# Patient Record
Sex: Male | Born: 1995 | Race: White | Hispanic: No | Marital: Single | Smoking: Heavy tobacco smoker
Health system: Southern US, Community
[De-identification: ages and names within clinical notes are randomized; demographics above are authoritative.]

## PROBLEM LIST (undated history)

## (undated) ENCOUNTER — Emergency Department (HOSPITAL_COMMUNITY): Discharge status: Absent without leave | Payer: Self-pay | Attending: Emergency Medicine | Admitting: Emergency Medicine

## (undated) DIAGNOSIS — M629 Disorder of muscle, unspecified: Secondary | ICD-10-CM

## (undated) DIAGNOSIS — F329 Major depressive disorder, single episode, unspecified: Secondary | ICD-10-CM

## (undated) DIAGNOSIS — F32A Depression, unspecified: Secondary | ICD-10-CM

## (undated) DIAGNOSIS — F419 Anxiety disorder, unspecified: Secondary | ICD-10-CM

## (undated) HISTORY — PX: TONSILLECTOMY: SUR1361

---
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
## 2016-11-25 ENCOUNTER — Ambulatory Visit (HOSPITAL_BASED_OUTPATIENT_CLINIC_OR_DEPARTMENT_OTHER)
Admission: EM | Admit: 2016-11-25 | Discharge: 2016-11-25 | Disposition: A | Discharge status: Home / Self Care | Payer: BLUE CROSS/BLUE SHIELD | Attending: Emergency Medicine | Admitting: Emergency Medicine

## 2016-11-25 ENCOUNTER — Emergency Department (HOSPITAL_COMMUNITY): Payer: BLUE CROSS/BLUE SHIELD | Admitting: Anesthesiology

## 2016-11-25 ENCOUNTER — Emergency Department (HOSPITAL_BASED_OUTPATIENT_CLINIC_OR_DEPARTMENT_OTHER): Payer: BLUE CROSS/BLUE SHIELD

## 2016-11-25 ENCOUNTER — Encounter (HOSPITAL_BASED_OUTPATIENT_CLINIC_OR_DEPARTMENT_OTHER): Payer: Self-pay | Admitting: Emergency Medicine

## 2016-11-25 ENCOUNTER — Encounter (HOSPITAL_COMMUNITY)
Admission: EM | Disposition: A | Discharge status: Home / Self Care | Payer: Self-pay | Source: Home / Self Care | Attending: Emergency Medicine

## 2016-11-25 DIAGNOSIS — S62633B Displaced fracture of distal phalanx of left middle finger, initial encounter for open fracture: Secondary | ICD-10-CM

## 2016-11-25 DIAGNOSIS — S61319A Laceration without foreign body of unspecified finger with damage to nail, initial encounter: Secondary | ICD-10-CM

## 2016-11-25 DIAGNOSIS — W298XXA Contact with other powered powered hand tools and household machinery, initial encounter: Secondary | ICD-10-CM | POA: Insufficient documentation

## 2016-11-25 DIAGNOSIS — S62631B Displaced fracture of distal phalanx of left index finger, initial encounter for open fracture: Secondary | ICD-10-CM | POA: Insufficient documentation

## 2016-11-25 HISTORY — PX: CLOSED REDUCTION FINGER WITH PERCUTANEOUS PINNING: SHX5612

## 2016-11-25 SURGERY — CLOSED REDUCTION, FINGER, WITH PERCUTANEOUS PINNING
Anesthesia: General | Site: Finger | Laterality: Left

## 2016-11-25 MED ORDER — BUPIVACAINE HCL (PF) 0.25 % IJ SOLN
INTRAMUSCULAR | Status: AC
  Filled 2016-11-25: qty 30

## 2016-11-25 MED ORDER — MORPHINE SULFATE (PF) 4 MG/ML IV SOLN
4.0000 mg | Freq: Once | INTRAVENOUS | Status: AC
  Administered 2016-11-25: 4 mg via INTRAVENOUS
  Filled 2016-11-25: qty 1

## 2016-11-25 MED ORDER — FENTANYL CITRATE (PF) 250 MCG/5ML IJ SOLN
INTRAMUSCULAR | Status: DC | PRN
  Administered 2016-11-25 (×2): 50 ug via INTRAVENOUS

## 2016-11-25 MED ORDER — HYDROMORPHONE HCL 1 MG/ML IJ SOLN: 0.2500 mg | INTRAMUSCULAR | Status: DC | PRN

## 2016-11-25 MED ORDER — CEFAZOLIN IN D5W 1 GM/50ML IV SOLN
INTRAVENOUS | Status: DC | PRN
  Administered 2016-11-25: 1 g via INTRAVENOUS

## 2016-11-25 MED ORDER — BUPIVACAINE HCL (PF) 0.25 % IJ SOLN
INTRAMUSCULAR | Status: DC | PRN
  Administered 2016-11-25: 7 mL

## 2016-11-25 MED ORDER — DOCUSATE SODIUM 100 MG PO CAPS: 100.0000 mg | ORAL_CAPSULE | Freq: Two times a day (BID) | ORAL | 0 refills | Status: DC

## 2016-11-25 MED ORDER — OXYCODONE-ACETAMINOPHEN 5-325 MG PO TABS: 1.0000 | ORAL_TABLET | Freq: Three times a day (TID) | ORAL | 0 refills | Status: AC

## 2016-11-25 MED ORDER — TETANUS-DIPHTH-ACELL PERTUSSIS 5-2.5-18.5 LF-MCG/0.5 IM SUSP
0.5000 mL | Freq: Once | INTRAMUSCULAR | Status: AC
  Administered 2016-11-25: 0.5 mL via INTRAMUSCULAR
  Filled 2016-11-25: qty 0.5

## 2016-11-25 MED ORDER — CEFAZOLIN IN D5W 1 GM/50ML IV SOLN
1.0000 g | Freq: Once | INTRAVENOUS | Status: AC
  Administered 2016-11-25: 1 g via INTRAVENOUS

## 2016-11-25 MED ORDER — FENTANYL CITRATE (PF) 100 MCG/2ML IJ SOLN
INTRAMUSCULAR | Status: AC
  Filled 2016-11-25: qty 2

## 2016-11-25 MED ORDER — MIDAZOLAM HCL 2 MG/2ML IJ SOLN
INTRAMUSCULAR | Status: AC
  Filled 2016-11-25: qty 2

## 2016-11-25 MED ORDER — MIDAZOLAM HCL 5 MG/5ML IJ SOLN
INTRAMUSCULAR | Status: DC | PRN
  Administered 2016-11-25: 2 mg via INTRAVENOUS

## 2016-11-25 MED ORDER — SUCCINYLCHOLINE CHLORIDE 20 MG/ML IJ SOLN
INTRAMUSCULAR | Status: DC | PRN
  Administered 2016-11-25: 120 mg via INTRAVENOUS

## 2016-11-25 MED ORDER — PROPOFOL 10 MG/ML IV BOLUS
INTRAVENOUS | Status: DC | PRN
  Administered 2016-11-25: 80 mg via INTRAVENOUS
  Administered 2016-11-25: 200 mg via INTRAVENOUS

## 2016-11-25 MED ORDER — 0.9 % SODIUM CHLORIDE (POUR BTL) OPTIME
TOPICAL | Status: DC | PRN
  Administered 2016-11-25: 1000 mL

## 2016-11-25 MED ORDER — CEPHALEXIN 500 MG PO CAPS: 500.0000 mg | ORAL_CAPSULE | Freq: Four times a day (QID) | ORAL | 0 refills | Status: DC

## 2016-11-25 MED ORDER — PROPOFOL 10 MG/ML IV BOLUS
INTRAVENOUS | Status: AC
  Filled 2016-11-25: qty 40

## 2016-11-25 MED ORDER — ONDANSETRON HCL 4 MG/2ML IJ SOLN
INTRAMUSCULAR | Status: DC | PRN
  Administered 2016-11-25: 4 mg via INTRAVENOUS

## 2016-11-25 MED ORDER — CEFAZOLIN IN D5W 1 GM/50ML IV SOLN
INTRAVENOUS | Status: AC
  Administered 2016-11-25: 1 g via INTRAVENOUS
  Filled 2016-11-25: qty 50

## 2016-11-25 MED ORDER — LIDOCAINE HCL (CARDIAC) 20 MG/ML IV SOLN
INTRAVENOUS | Status: DC | PRN
  Administered 2016-11-25: 60 mg via INTRATRACHEAL

## 2016-11-25 MED ORDER — LACTATED RINGERS IV SOLN
INTRAVENOUS | Status: DC | PRN
  Administered 2016-11-25: 19:00:00 via INTRAVENOUS

## 2016-11-25 SURGICAL SUPPLY — 43 items
BANDAGE ACE 4X5 VEL STRL LF (GAUZE/BANDAGES/DRESSINGS) IMPLANT
BANDAGE ELASTIC 3 VELCRO ST LF (GAUZE/BANDAGES/DRESSINGS) IMPLANT
BENZOIN TINCTURE PRP APPL 2/3 (GAUZE/BANDAGES/DRESSINGS) IMPLANT
BLADE SURG ROTATE 9660 (MISCELLANEOUS) IMPLANT
BNDG COHESIVE 1X5 TAN STRL LF (GAUZE/BANDAGES/DRESSINGS) ×3 IMPLANT
BNDG CONFORM 2 STRL LF (GAUZE/BANDAGES/DRESSINGS) ×3 IMPLANT
BNDG GAUZE ELAST 4 BULKY (GAUZE/BANDAGES/DRESSINGS) IMPLANT
CLOSURE WOUND 1/2 X4 (GAUZE/BANDAGES/DRESSINGS)
COVER SURGICAL LIGHT HANDLE (MISCELLANEOUS) ×3 IMPLANT
CUFF TOURNIQUET SINGLE 18IN (TOURNIQUET CUFF) ×3 IMPLANT
CUFF TOURNIQUET SINGLE 24IN (TOURNIQUET CUFF) IMPLANT
DRAPE OEC MINIVIEW 54X84 (DRAPES) ×3 IMPLANT
DRSG EMULSION OIL 3X3 NADH (GAUZE/BANDAGES/DRESSINGS) IMPLANT
GAUZE SPONGE 4X4 12PLY STRL (GAUZE/BANDAGES/DRESSINGS) ×3 IMPLANT
GAUZE XEROFORM 1X8 LF (GAUZE/BANDAGES/DRESSINGS) ×3 IMPLANT
GLOVE BIOGEL PI IND STRL 7.0 (GLOVE) ×1 IMPLANT
GLOVE BIOGEL PI IND STRL 8.5 (GLOVE) ×1 IMPLANT
GLOVE BIOGEL PI INDICATOR 7.0 (GLOVE) ×2
GLOVE BIOGEL PI INDICATOR 8.5 (GLOVE) ×2
GLOVE SURG ORTHO 8.0 STRL STRW (GLOVE) ×3 IMPLANT
GOWN STRL REUS W/ TWL LRG LVL3 (GOWN DISPOSABLE) ×3 IMPLANT
GOWN STRL REUS W/TWL LRG LVL3 (GOWN DISPOSABLE) ×6
K-WIRE .045X45 (WIRE) ×6 IMPLANT
KIT BASIN OR (CUSTOM PROCEDURE TRAY) ×3 IMPLANT
KIT ROOM TURNOVER OR (KITS) ×3 IMPLANT
NEEDLE HYPO 25GX1X1/2 BEV (NEEDLE) ×3 IMPLANT
NS IRRIG 1000ML POUR BTL (IV SOLUTION) ×3 IMPLANT
PACK ORTHO EXTREMITY (CUSTOM PROCEDURE TRAY) ×3 IMPLANT
PAD ARMBOARD 7.5X6 YLW CONV (MISCELLANEOUS) ×6 IMPLANT
SOAP 2 % CHG 4 OZ (WOUND CARE) ×3 IMPLANT
SPLINT FINGER (SOFTGOODS) ×6 IMPLANT
SPONGE GAUZE 4X4 12PLY STER LF (GAUZE/BANDAGES/DRESSINGS) ×3 IMPLANT
STRIP CLOSURE SKIN 1/2X4 (GAUZE/BANDAGES/DRESSINGS) IMPLANT
SUT CHROMIC 6 0 PS 4 (SUTURE) ×3 IMPLANT
SUT ETHILON 4 0 P 3 18 (SUTURE) IMPLANT
SUT ETHILON 5 0 P 3 18 (SUTURE)
SUT NYLON ETHILON 5-0 P-3 1X18 (SUTURE) IMPLANT
SUT PROLENE 4 0 P 3 18 (SUTURE) IMPLANT
SUT PROLENE 4 0 PS 2 18 (SUTURE) ×6 IMPLANT
SYR CONTROL 10ML LL (SYRINGE) ×3 IMPLANT
TOWEL OR 17X24 6PK STRL BLUE (TOWEL DISPOSABLE) ×3 IMPLANT
TOWEL OR 17X26 10 PK STRL BLUE (TOWEL DISPOSABLE) ×3 IMPLANT
WATER STERILE IRR 1000ML POUR (IV SOLUTION) IMPLANT

## 2016-11-25 NOTE — H&P (Signed)
Todd Aguilar is an 21 y.o. male.   Chief Complaint:Left index and long finger open distal phalanx injuries, finger pain HPI: Todd Aguilar is a 21 y.o. male who presents to the Emergency Department complaining of left hand injury that occurred about 3  hours ago. He states he accidentally cut his left 2nd and 3rd fingers with a table saw. He reports associated wounds and constant moderate pain rated as a 7/10. His tetanus status is unknown. He denies any other complaints.  Last had soda at 130PM.   History reviewed. No pertinent past medical history.  Past Surgical History:  Procedure Laterality Date  . TONSILLECTOMY      History reviewed. No pertinent family history. Social History:  reports that he has never smoked. He has never used smokeless tobacco. He reports that he does not drink alcohol or use drugs.  Allergies: No Known Allergies  Medications Prior to Admission  Medication Sig Dispense Refill  . Fexofenadine HCl ([name] PO) Take by mouth.    . Omega-3 Fatty Acids (FISH OIL PO) Take 1 tablet by mouth daily.      No results found for this or any previous visit (from the past 48 hour(s)). Dg Hand Complete Left  Result Date: 11/25/2016 CLINICAL DATA:  Sol accident with trauma to the second third digits, initial encounter EXAM: LEFT HAND - COMPLETE 3+ VIEW COMPARISON:  None. FINDINGS: Comminuted fractures of the second and third distal phalanges are seen. Overlying soft tissue injury is noted as well. No other bony abnormality is seen. IMPRESSION: Comminuted fractures of the second and third distal phalanges consistent with the given clinical history. Electronically Signed   By: Alcide Clever M.D.   On: 11/25/2016 16:29    ROS; NO RECENT ILLNESSES OR HOSPITALIZATIONS  Blood pressure 129/77, pulse 64, temperature 97.8 F (36.6 C), temperature source Oral, resp. rate 20, height 5\' 5"  (1.651 m), weight 180 lb (81.6 kg), SpO2 100 %. Physical Exam  General Appearance:  Alert,  cooperative, no distress, appears stated age  Head:  Normocephalic, without obvious abnormality, atraumatic  Eyes:  Pupils equal, conjunctiva/corneas clear,         Throat: Lips, mucosa, and tongue normal; teeth and gums normal  Neck: No visible masses     Lungs:   respirations unlabored  Chest Wall:  No tenderness or deformity  Heart:  Regular rate and rhythm,  Abdomen:   Soft, non-tender,         Extremities: LEFT INDEX AND LONG FINGER: PICTURES IN CHART, FINGER TIPS WARM WELL PERFUSED LACERATIONS INVOLVE BOTH NAIL BEDS ABLE TO FLEX DIP JOINTS ABLE TO FLEX PIP JOINTS  Pulses: 2+ and symmetric  Skin: Skin color, texture, turgor normal, no rashes or lesions     Neurologic: Normal    Assessment/Plan LEFT OPEN INDEX AND LONG FINGER DISTAL PHALANGEAL FRACTURES  LEFT INDEX AND LONG FINGER OPEN DEBRIDEMENTS AND REPAIR AS INDICATED  R/B/A DISCUSSED WITH PT IN HOSPITAL.  PT VOICED UNDERSTANDING OF PLAN CONSENT SIGNED DAY OF SURGERY PT SEEN AND EXAMINED PRIOR TO OPERATIVE PROCEDURE/DAY OF SURGERY SITE MARKED. QUESTIONS ANSWERED WILL GO HOME FOLLOWING SURGERY  WE ARE PLANNING SURGERY FOR YOUR UPPER EXTREMITY. THE RISKS AND BENEFITS OF SURGERY INCLUDE BUT NOT LIMITED TO BLEEDING INFECTION, DAMAGE TO NEARBY NERVES ARTERIES TENDONS, FAILURE OF SURGERY TO ACCOMPLISH ITS INTENDED GOALS, PERSISTENT SYMPTOMS AND NEED FOR FURTHER SURGICAL INTERVENTION. WITH THIS IN MIND WE WILL PROCEED. I HAVE DISCUSSED WITH THE PATIENT THE PRE AND POSTOPERATIVE REGIMEN AND THE  DOS AND DON'TS. PT VOICED UNDERSTANDING AND INFORMED CONSENT SIGNED.  Todd Aguilar 11/25/2016, 7:13 PM

## 2016-11-25 NOTE — ED Triage Notes (Signed)
Patient has a partially amputated left 1st finger tip and 2nd finger from a table saw

## 2016-11-25 NOTE — ED Notes (Signed)
1st and 2nd digits wrapped seperately with saline guaze and a kling. Kling wrapped placed over IV site. Patient and mother understand patient is to go directly to [facility] and [facility] being [facility]. Patient is also aware to not eat or drink anything while en route.

## 2016-11-25 NOTE — Transfer of Care (Signed)
Immediate Anesthesia Transfer of Care Note  Patient: Todd Aguilar  Procedure(s) Performed: Procedure(s) with comments: CLOSED REDUCTION WITH PERCUTANEOUS PINNING - INDEX AND LONG FINGERS (Left) - LEFT INDEX AND LONG FINGERS  Patient Location: PACU  Anesthesia Type:General  Level of Consciousness: awake  Airway & Oxygen Therapy: Patient Spontanous Breathing  Post-op Assessment: Report given to RN and Post -op Vital signs reviewed and stable  Post vital signs: Reviewed and stable  Last Vitals:  Vitals:   11/25/16 1700 11/25/16 2028  BP: 129/77 (P) 113/64  Pulse: 64 (P) 99  Resp: 20   Temp:  (P) 36.7 C    Last Pain:  Vitals:   11/25/16 1656  TempSrc:   PainSc: 6          Complications: No apparent anesthesia complications

## 2016-11-25 NOTE — Anesthesia Preprocedure Evaluation (Addendum)
Anesthesia Evaluation  Patient identified by MRN, date of birth, ID band Patient awake    Reviewed: Allergy & Precautions, H&P , NPO status , Patient's Chart, lab work & pertinent test results  Airway Mallampati: I  TM Distance: >3 FB Neck ROM: Full    Dental no notable dental hx. (+) Teeth Intact, Dental Advisory Given   Pulmonary neg pulmonary ROS,    Pulmonary exam normal breath sounds clear to auscultation       Cardiovascular negative cardio ROS   Rhythm:Regular Rate:Normal     Neuro/Psych negative neurological ROS  negative psych ROS   GI/Hepatic negative GI ROS, Neg liver ROS,   Endo/Other  negative endocrine ROS  Renal/GU negative Renal ROS  negative genitourinary   Musculoskeletal   Abdominal   Peds  Hematology negative hematology ROS (+)   Anesthesia Other Findings   Reproductive/Obstetrics negative OB ROS                            Anesthesia Physical Anesthesia Plan  ASA: I and emergent  Anesthesia Plan: General   Post-op Pain Management:    Induction: Intravenous, Rapid sequence and Cricoid pressure planned  Airway Management Planned: Oral ETT  Additional Equipment:   Intra-op Plan:   Post-operative Plan: Extubation in OR  Informed Consent: I have reviewed the patients History and Physical, chart, labs and discussed the procedure including the risks, benefits and alternatives for the proposed anesthesia with the patient or authorized representative who has indicated his/her understanding and acceptance.   Dental advisory given  Plan Discussed with: CRNA, Anesthesiologist and Surgeon  Anesthesia Plan Comments:        Anesthesia Quick Evaluation

## 2016-11-25 NOTE — Brief Op Note (Signed)
JOB ID dictated: [id number] orif left long and index finger open reduction and internal fixation and repair  Home after surgery F/u in office in 8 days

## 2016-11-25 NOTE — ED Provider Notes (Signed)
[facility] Provider Note   CSN: [id number] Arrival date & time: 11/25/16  1517  By signing my name below, I, Modena JanskyAlbert Thayil, attest that this documentation has been prepared under the direction and in the presence of Alvira MondayErin Barbera Perritt, MD. Electronically Signed: Modena JanskyAlbert Thayil, Scribe. 11/25/2016. 4:27 PM.  History   Chief Complaint Chief Complaint  Patient presents with  . Hand Injury   The history is provided by the patient. No language interpreter was used.   HPI Comments: Todd Aguilar is a 21 y.o. male who presents to the Emergency Department complaining of left hand injury that occurred about 1.5 hours ago. He states he accidentally cut his left 2nd and 3rd fingers with a table saw. He reports associated wounds and constant moderate pain rated as a 7/10. His tetanus status is unknown. He denies any other complaints.  Last had soda at 130PM.   History reviewed. No pertinent past medical history.  There are no active problems to display for this patient.   Past Surgical History:  Procedure Laterality Date  . TONSILLECTOMY         Home Medications    Prior to Admission medications   Medication Sig Start Date End Date Taking? Authorizing Provider  Fexofenadine HCl ([name] PO) Take by mouth.    Historical Provider, MD  Omega-3 Fatty Acids (FISH OIL PO) Take 1 tablet by mouth daily.    Historical Provider, MD    Family History History reviewed. No pertinent family history.  Social History Social History  Substance Use Topics  . Smoking status: Never Smoker  . Smokeless tobacco: Never Used  . Alcohol use No     Allergies   Patient has no known allergies.   Review of Systems Review of Systems  Musculoskeletal: Positive for myalgias.  Skin: Positive for wound.     Physical Exam Updated Vital Signs BP 136/85   Pulse 69   Temp 97.8 F (36.6 C) (Oral)   Resp 22   Ht 5\' 5"  (1.651 m)   Wt 180 lb (81.6 kg)   SpO2 100%   BMI 29.95 kg/m    Physical Exam  Constitutional: He appears well-developed and well-nourished. No distress.  HENT:  Head: Normocephalic and atraumatic.  Eyes: Conjunctivae are normal.  Neck: Neck supple.  Cardiovascular: Normal rate.   Pulmonary/Chest: Effort normal.  Abdominal: Soft.  Musculoskeletal: Normal range of motion.  See photos Reports altered sensation of tips of fingers, however has sensation Tips of fingers pink, cap refill difficult to assess  Neurological: He is alert.  Skin: Skin is warm and dry.  2nd finger: 3cm defect with loss of nail going through and though. Fingertip is pink.  3rd finger: Laceration that wraps around the ulnar side of the tip initiating at the nail bed. Approximately 4 cm. Macerated tissue.   Psychiatric: He has a normal mood and affect.  Nursing note and vitals reviewed.     ED Treatments / Results  DIAGNOSTIC STUDIES: Oxygen Saturation is 100% on RA, normal by my interpretation.    COORDINATION OF CARE: 4:31 PM- Pt advised of plan for treatment and pt agrees.  Labs (all labs ordered are listed, but only abnormal results are displayed) Labs Reviewed - No data to display  EKG  EKG Interpretation None       Radiology Dg Hand Complete Left  Result Date: 11/25/2016 CLINICAL DATA:  Sol accident with trauma to the second third digits, initial encounter EXAM: LEFT HAND - COMPLETE 3+ VIEW COMPARISON:  None. FINDINGS: Comminuted fractures of the second and third distal phalanges are seen. Overlying soft tissue injury is noted as well. No other bony abnormality is seen. IMPRESSION: Comminuted fractures of the second and third distal phalanges consistent with the given clinical history. Electronically Signed   By: Alcide Clever M.D.   On: 11/25/2016 16:29    Procedures Procedures (including critical care time)  Medications Ordered in ED Medications  ceFAZolin (ANCEF) IVPB 1 g/50 mL premix (0 g Intravenous Stopped 11/25/16 1647)  morphine 4 MG/ML  injection 4 mg (4 mg Intravenous Given 11/25/16 1548)  Tdap (BOOSTRIX) injection 0.5 mL (0.5 mLs Intramuscular Given 11/25/16 1657)     Initial Impression / Assessment and Plan / ED Course  I have reviewed the triage vital signs and the nursing notes.  Pertinent labs & imaging results that were available during my care of the patient were reviewed by me and considered in my medical decision making (see chart for details).            21 year old male with a similar medical history presents with concern for laceration and pain to his index and middle finger after cutting it with a table saw at 3:00 today. Patient is not sure if he is up-to-date on tetanus, and tetanus status was updated. He is given Ancef for concern of open fracture. X-ray shows comminuted middle and index finger distal phalanx fractures.   Discussed with Dr. Melvyn Novas given open distal phalanx fractures with loss of tissue nail, and he will be seeing the patient at [facility] and perform repair.   Final Clinical Impressions(s) / ED Diagnoses   Final diagnoses:  Laceration of finger of left hand with damage to nail, foreign body presence unspecified, unspecified finger, initial encounter  Displaced fracture of distal phalanx of left middle finger, initial encounter for open fracture  Open displaced fracture of distal phalanx of left index finger, initial encounter    New Prescriptions New Prescriptions   No medications on file   I personally performed the services described in this documentation, which was scribed in my presence. The recorded information has been reviewed and is accurate.     Alvira Monday, MD 11/25/16 [phone]

## 2016-11-25 NOTE — Anesthesia Procedure Notes (Signed)
Procedure Name: Intubation Date/Time: 11/25/2016 7:32 PM Performed by: Brien MatesMAHONY, Xylah Early D Pre-anesthesia Checklist: Patient identified, Emergency Drugs available, Suction available, Patient being monitored and Timeout performed Patient Re-evaluated:Patient Re-evaluated prior to inductionOxygen Delivery Method: Circle system utilized Preoxygenation: Pre-oxygenation with 100% oxygen Intubation Type: IV induction, Rapid sequence and Cricoid Pressure applied Laryngoscope Size: Miller and 2 Grade View: Grade I Tube type: Oral Tube size: 7.5 mm Number of attempts: 1 Airway Equipment and Method: Stylet Placement Confirmation: ETT inserted through vocal cords under direct vision,  positive ETCO2 and breath sounds checked- equal and bilateral Secured at: 22 cm Tube secured with: Tape Dental Injury: Teeth and Oropharynx as per pre-operative assessment

## 2016-11-25 NOTE — ED Notes (Signed)
Patient transported to X-ray 

## 2016-11-25 NOTE — ED Notes (Signed)
Patient states he cut his first and second digits on the left hand with a table saw. Bandage in place at this time. Patient mother at bedside reports she believes the last tetanus was within past 5 years.

## 2016-11-25 NOTE — ED Notes (Signed)
Patient last meal at @ noon.

## 2016-11-25 NOTE — ED Notes (Signed)
First and second digit fingertips split. Applied saline soaked guaze to each finger and wrapped with kling to secure.

## 2016-11-25 NOTE — Discharge Instructions (Signed)
KEEP BANDAGE CLEAN AND DRY °CALL OFFICE FOR F/U APPT [phone] IN 8 DAYS °DR Corleen Otwell CELL [phone] °KEEP HAND ELEVATED ABOVE HEART °OK TO APPLY ICE TO OPERATIVE AREA °CONTACT OFFICE IF ANY WORSENING PAIN OR CONCERNS. °

## 2016-11-25 NOTE — ED Notes (Signed)
XR notified patient is ready for imaging.

## 2016-11-26 ENCOUNTER — Encounter (HOSPITAL_COMMUNITY): Payer: Self-pay | Admitting: Orthopedic Surgery

## 2016-11-26 NOTE — Op Note (Signed)
NAME:  Todd, Aguilar NO.:  [id number]  MEDICAL RECORD NO.:  [id number]  LOCATION:                                 FACILITY:  PHYSICIAN:  Sharma Covert IV, M.D.DATE OF BIRTH:  07/02/1996  DATE OF PROCEDURE:  11/25/2016 DATE OF DISCHARGE:                              OPERATIVE REPORT   PREOPERATIVE DIAGNOSES: 1. Left index finger open distal phalanx fracture with nail bed     injury. 2. Left long finger open distal phalanx fracture with nail bed injury.  POSTOPERATIVE DIAGNOSES: 1. Left index finger open distal phalanx fracture with nail bed     injury. 2. Left long finger open distal phalanx fracture with nail bed injury.  ATTENDING PHYSICIAN:  Sharma Covert, M.D., who scrubbed and present for the entire procedure.  ASSISTANT SURGEON:  None.  ANESTHESIA:  General via endotracheal tube.  SURGICAL PROCEDURE: 1. Debridement of skin, subcutaneous tissue, muscle, and bone     associated with open fracture, left index finger. 2. Debridement of skin, subcutaneous tissue, and bone, left long     finger open fracture. 3. Open treatment of left index finger distal phalanx fracture     requiring internal fixation. 4. Open treatment of left long finger, open treatment of distal     phalanx fracture requiring internal fixation. 5. Repair of left index finger nail bed. 6. Repair of left long finger nail bed. 7. Traumatic laceration to left index finger, 2.5 cm. 8. Traumatic laceration repair, left long finger 3 cm. 9. Radiographs 2 views, left index finger. 10.Radiographs 2 views, left long finger.  SURGICAL IMPLANTS:  Two 0.045 K-wires.  RADIOGRAPHIC INTERPRETATION:  AP, lateral, and oblique views of the finger did show the K-wire fixation in place.  There was good position in all planes.  SURGICAL INDICATIONS:  Todd Aguilar is a right-hand-dominant gentleman, who sustained the open injuries from a table saw.  The patient was seen and evaluated and  recommended to undergo the above procedure.  Risks, benefits, and alternatives were discussed in detail with the patient and signed informed consent was obtained.  Risks include, but not limited to bleeding, infection, damage to nearby nerves, arteries, or tendons; loss of motion to the wrist and digits, incomplete relief of symptoms, and need for further surgical intervention.  DESCRIPTION OF PROCEDURE:  The patient was properly identified in the preoperative holding area, mark with a permanent marker made on left index and long finger to indicate the correct operative sites.  The patient was then brought back to the operating room, placed supine on the anesthesia room table where general anesthesia was administered. Preoperative antibiotics were given.  A well-padded tourniquet was placed in the left brachium and sealed with 1000 drape.  The left upper extremity was then prepped and draped in normal sterile fashion.  Time- out was called, correct site was identified, and procedure then begun. Attention turned to the index finger.  Open debridement of skin and subcutaneous tissue was carried out with rongeurs, curettes, and a knife removing the devitalized tissue and the devitalized bone.  Same procedure was then done to the long finger.  Open debris was then carried  out both with the same instrumentations.  Removal of the devitalized tissue was then carried out.  Thorough wound irrigation was done after the open debridement.  Following this, internal fixation was then used to stabilize both index and long fingers with 0.045 K-wire. These were then confirmed using mini C-arm.  After open treatment of both fractures, nail beds were then repaired with 6-0 chromic sutures. The traumatic laceration to the index finger and the long finger were then repaired, 2.5 cm to the index finger repaired with Prolene sutures and 3 cm to the long finger was repaired with Prolene sutures.   Xeroform dressing, sterile compressive bandage were then applied.  Final radiographs were obtained using mini C-arm, two views of both fingers. The patient was then placed in a small finger dressing, finger splint. 0.25% Marcaine was infiltrated, 5 mL in both fingers flexor sheath blocks.  Good perfusion of the fingertips.  The patient tolerated the procedure well, extubated, and taken to the recovery room in good condition.  INTRAOPERATIVE RADIOGRAPHS:  AP and lateral views of the left index and AP and lateral views of the long finger did show the K-wire fixation placed in good alignment.  PLAN:  The patient will be discharged to home, seen back in the office in 8 days for wound check, and down to see our therapist for tip protector splint.  Begin x-rays at the first visit and sutures at the 2- week mark and pins in for a total 4 weeks.  Radiographs at each visit.     Madelynn DoneFred W. Tamey Wanek IV, M.D.   ______________________________ Madelynn DoneFred W. Ziara Thelander IV, M.D.    FWO/MEDQ  D:  11/25/2016  T:  11/26/2016  Job:  [id number]

## 2016-11-26 NOTE — Anesthesia Postprocedure Evaluation (Signed)
Anesthesia Post Note  Patient: Fuller Mandrileter Kirchman  Procedure(s) Performed: Procedure(s) (LRB): CLOSED REDUCTION WITH PERCUTANEOUS PINNING - INDEX AND LONG FINGERS (Left)  Patient location during evaluation: PACU Anesthesia Type: General Level of consciousness: awake and alert Pain management: pain level controlled Vital Signs Assessment: post-procedure vital signs reviewed and stable Respiratory status: spontaneous breathing, nonlabored ventilation and respiratory function stable Cardiovascular status: blood pressure returned to baseline and stable Postop Assessment: no signs of nausea or vomiting Anesthetic complications: no       Last Vitals:  Vitals:   11/25/16 2100 11/25/16 2107  BP:    Pulse:  75  Resp:  12  Temp: 36.9 C     Last Pain:  Vitals:   11/25/16 2100  TempSrc:   PainSc: 0-No pain                 Jamela Cumbo,W. EDMOND

## 2017-02-19 ENCOUNTER — Encounter (HOSPITAL_COMMUNITY): Payer: Self-pay

## 2017-02-19 ENCOUNTER — Inpatient Hospital Stay (HOSPITAL_COMMUNITY)
Admission: RE | Admit: 2017-02-19 | Discharge: 2017-02-26 | DRG: 885 | Disposition: A | Discharge status: Home / Self Care | Payer: BLUE CROSS/BLUE SHIELD | Attending: Psychiatry | Admitting: Psychiatry

## 2017-02-19 ENCOUNTER — Encounter (HOSPITAL_COMMUNITY): Payer: Self-pay | Admitting: Emergency Medicine

## 2017-02-19 ENCOUNTER — Emergency Department (HOSPITAL_COMMUNITY)
Admission: EM | Admit: 2017-02-19 | Discharge: 2017-02-19 | Disposition: A | Discharge status: Other Acute Inpatient Hospital | Payer: Managed Care, Other (non HMO) | Attending: Physician Assistant | Admitting: Physician Assistant

## 2017-02-19 DIAGNOSIS — Z79899 Other long term (current) drug therapy: Secondary | ICD-10-CM | POA: Diagnosis not present

## 2017-02-19 DIAGNOSIS — Z639 Problem related to primary support group, unspecified: Secondary | ICD-10-CM | POA: Diagnosis not present

## 2017-02-19 DIAGNOSIS — F419 Anxiety disorder, unspecified: Secondary | ICD-10-CM | POA: Diagnosis present

## 2017-02-19 DIAGNOSIS — G710 Muscular dystrophy: Secondary | ICD-10-CM | POA: Diagnosis present

## 2017-02-19 DIAGNOSIS — R443 Hallucinations, unspecified: Secondary | ICD-10-CM | POA: Insufficient documentation

## 2017-02-19 DIAGNOSIS — F1010 Alcohol abuse, uncomplicated: Secondary | ICD-10-CM | POA: Diagnosis not present

## 2017-02-19 DIAGNOSIS — R45851 Suicidal ideations: Secondary | ICD-10-CM | POA: Diagnosis present

## 2017-02-19 DIAGNOSIS — F1220 Cannabis dependence, uncomplicated: Secondary | ICD-10-CM | POA: Diagnosis present

## 2017-02-19 DIAGNOSIS — Z590 Homelessness: Secondary | ICD-10-CM | POA: Diagnosis not present

## 2017-02-19 DIAGNOSIS — F17210 Nicotine dependence, cigarettes, uncomplicated: Secondary | ICD-10-CM | POA: Diagnosis present

## 2017-02-19 DIAGNOSIS — Z720 Tobacco use: Secondary | ICD-10-CM

## 2017-02-19 DIAGNOSIS — F909 Attention-deficit hyperactivity disorder, unspecified type: Secondary | ICD-10-CM | POA: Diagnosis present

## 2017-02-19 DIAGNOSIS — R112 Nausea with vomiting, unspecified: Secondary | ICD-10-CM

## 2017-02-19 DIAGNOSIS — G4700 Insomnia, unspecified: Secondary | ICD-10-CM | POA: Diagnosis present

## 2017-02-19 DIAGNOSIS — F323 Major depressive disorder, single episode, severe with psychotic features: Secondary | ICD-10-CM | POA: Diagnosis present

## 2017-02-19 DIAGNOSIS — Z915 Personal history of self-harm: Secondary | ICD-10-CM

## 2017-02-19 DIAGNOSIS — F4010 Social phobia, unspecified: Secondary | ICD-10-CM | POA: Diagnosis present

## 2017-02-19 DIAGNOSIS — F1290 Cannabis use, unspecified, uncomplicated: Secondary | ICD-10-CM | POA: Diagnosis not present

## 2017-02-19 HISTORY — DX: Anxiety disorder, unspecified: F41.9

## 2017-02-19 HISTORY — DX: Major depressive disorder, single episode, unspecified: F32.9

## 2017-02-19 HISTORY — DX: Depression, unspecified: F32.A

## 2017-02-19 HISTORY — DX: Disorder of muscle, unspecified: M62.9

## 2017-02-19 LAB — COMPREHENSIVE METABOLIC PANEL
ALK PHOS: 66 U/L (ref 38–126)
ALT: 24 U/L (ref 17–63)
AST: 27 U/L (ref 15–41)
Albumin: 5 g/dL (ref 3.5–5.0)
Anion gap: 8 (ref 5–15)
BUN: 13 mg/dL (ref 6–20)
CALCIUM: 10 mg/dL (ref 8.9–10.3)
CO2: 28 mmol/L (ref 22–32)
CREATININE: 0.95 mg/dL (ref 0.61–1.24)
Chloride: 104 mmol/L (ref 101–111)
Glucose, Bld: 97 mg/dL (ref 65–99)
Potassium: 4.1 mmol/L (ref 3.5–5.1)
Sodium: 140 mmol/L (ref 135–145)
Total Bilirubin: 0.8 mg/dL (ref 0.3–1.2)
Total Protein: 8.3 g/dL — ABNORMAL HIGH (ref 6.5–8.1)

## 2017-02-19 LAB — CBC
HCT: 43.8 % (ref 39.0–52.0)
HEMOGLOBIN: 15.5 g/dL (ref 13.0–17.0)
MCH: 30.1 pg (ref 26.0–34.0)
MCHC: 35.4 g/dL (ref 30.0–36.0)
MCV: 85 fL (ref 78.0–100.0)
Platelets: 257 10*3/uL (ref 150–400)
RBC: 5.15 MIL/uL (ref 4.22–5.81)
RDW: 13 % (ref 11.5–15.5)
WBC: 8.3 10*3/uL (ref 4.0–10.5)

## 2017-02-19 LAB — SALICYLATE LEVEL

## 2017-02-19 LAB — RAPID URINE DRUG SCREEN, HOSP PERFORMED
AMPHETAMINES: NOT DETECTED
Barbiturates: NOT DETECTED
Benzodiazepines: NOT DETECTED
Cocaine: NOT DETECTED
OPIATES: NOT DETECTED
TETRAHYDROCANNABINOL: POSITIVE — AB

## 2017-02-19 LAB — ACETAMINOPHEN LEVEL: Acetaminophen (Tylenol), Serum: <10 ug/mL — ABNORMAL LOW (ref 10–30)

## 2017-02-19 LAB — ETHANOL

## 2017-02-19 LAB — LIPASE, BLOOD: LIPASE: 27 U/L (ref 11–51)

## 2017-02-19 MED ORDER — ACETAMINOPHEN 500 MG PO TABS
500.0000 mg | ORAL_TABLET | Freq: Four times a day (QID) | ORAL | Status: DC | PRN
  Administered 2017-02-21 – 2017-02-23 (×4): 500 mg via ORAL
  Filled 2017-02-19 (×5): qty 1

## 2017-02-19 MED ORDER — MAGNESIUM HYDROXIDE 400 MG/5ML PO SUSP: 30.0000 mL | Freq: Every day | ORAL | Status: DC | PRN

## 2017-02-19 MED ORDER — LORAZEPAM 1 MG PO TABS: 1.0000 mg | ORAL_TABLET | Freq: Three times a day (TID) | ORAL | Status: DC | PRN

## 2017-02-19 MED ORDER — FLUOXETINE HCL 20 MG PO CAPS
20.0000 mg | ORAL_CAPSULE | Freq: Every day | ORAL | Status: DC
  Administered 2017-02-20: 20 mg via ORAL
  Filled 2017-02-19 (×3): qty 1

## 2017-02-19 MED ORDER — ALUM & MAG HYDROXIDE-SIMETH 200-200-20 MG/5ML PO SUSP: 30.0000 mL | ORAL | Status: DC | PRN

## 2017-02-19 MED ORDER — ONDANSETRON HCL 4 MG PO TABS: 4.0000 mg | ORAL_TABLET | Freq: Three times a day (TID) | ORAL | Status: DC | PRN

## 2017-02-19 MED ORDER — ACETAMINOPHEN 325 MG PO TABS: 650.0000 mg | ORAL_TABLET | ORAL | Status: DC | PRN

## 2017-02-19 MED ORDER — ONDANSETRON 8 MG PO TBDP
8.0000 mg | ORAL_TABLET | Freq: Once | ORAL | Status: AC
  Administered 2017-02-19: 8 mg via ORAL
  Filled 2017-02-19: qty 1

## 2017-02-19 MED ORDER — OLANZAPINE 5 MG PO TABS
5.0000 mg | ORAL_TABLET | Freq: Every day | ORAL | Status: DC
  Administered 2017-02-19: 5 mg via ORAL
  Filled 2017-02-19 (×4): qty 1

## 2017-02-19 MED ORDER — NICOTINE 21 MG/24HR TD PT24: 21.0000 mg | MEDICATED_PATCH | Freq: Every day | TRANSDERMAL | Status: DC

## 2017-02-19 MED ORDER — TRAZODONE HCL 50 MG PO TABS: 50.0000 mg | ORAL_TABLET | Freq: Every evening | ORAL | Status: DC | PRN

## 2017-02-19 MED ORDER — IBUPROFEN 200 MG PO TABS: 600.0000 mg | ORAL_TABLET | Freq: Three times a day (TID) | ORAL | Status: DC | PRN

## 2017-02-19 MED ORDER — TRAZODONE HCL 50 MG PO TABS
50.0000 mg | ORAL_TABLET | Freq: Every evening | ORAL | Status: DC | PRN
  Administered 2017-02-19: 50 mg via ORAL
  Filled 2017-02-19 (×6): qty 1

## 2017-02-19 MED ORDER — ZOLPIDEM TARTRATE 5 MG PO TABS: 5.0000 mg | ORAL_TABLET | Freq: Every evening | ORAL | Status: DC | PRN

## 2017-02-19 MED ORDER — HYDROXYZINE HCL 25 MG PO TABS
25.0000 mg | ORAL_TABLET | Freq: Four times a day (QID) | ORAL | Status: DC | PRN
  Administered 2017-02-21 – 2017-02-23 (×3): 25 mg via ORAL
  Filled 2017-02-19: qty 1
  Filled 2017-02-19: qty 10
  Filled 2017-02-19 (×3): qty 1

## 2017-02-19 NOTE — ED Notes (Signed)
Per [facility], patient is trying to kill himself by getting drunk-states he was a walk in at [facility] here for Medical clearance-will be going back once cleared

## 2017-02-19 NOTE — ED Triage Notes (Signed)
Pt states he is coming in for suicidal ideations.  Attempted suicide last night with OTC pain medication with alcohol.  States his parents recently kicked him out over smoking pot.  Endorses smoking 2-3 x a week.  Pt states he has not been treated for mental illness in the past and is not sure if he has a family history as he is adopted.  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

## 2017-02-19 NOTE — ED Notes (Signed)
Bed: RU04 Expected date:  Expected time:  Means of arrival:  Comments: [employer]

## 2017-02-19 NOTE — Progress Notes (Signed)
Psychoeducational Group Note  Date:  02/19/2017 Time:  2328  Group Topic/Focus:  Wrap-Up Group:   The focus of this group is to help patients review their daily goal of treatment and discuss progress on daily workbooks.  Participation Level: Did Not Attend  Participation Quality:  Not Applicable  Affect:  Not Applicable  Cognitive:  Not Applicable  Insight:  Not Applicable  Engagement in Group: Not Applicable  Additional Comments:  The patient was unable to attend group since he was not admitted to the hallway until after the group ended.   Hazle Coca S 02/19/2017, 11:28 PM
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
## 2017-02-19 NOTE — BHH Counselor (Signed)
[facility] Assessment Progress Note  Pt has been accepted to [facility], pending medical clearance. Call report to [phone].   [name]. Ladona Ridgel, MS, NCC, LPCA Counselor

## 2017-02-19 NOTE — H&P (Signed)
Behavioral Health Medical Screening Exam  Todd Aguilar is an 21 y.o. male, Caucasian. He came to the [facility] as a walk-in for worsening symptoms of depression & suicidal ideations with several plans & intent to kill himself. He reports that he was adopted. Was bullied a lot while in high school that made him drop out of high school. He says, over the years, the depression keeping getting worse & worse. He says he has attempted suicide numerous times that he can longer keep count of them. He says his depression has never been treated even though he has been tested for depression in the past. He reports today that the only regret he has about attempting suicide last night is that he did not go far enough to cause his own death. He says if he is allowed to leave this hospital, he will finish himself off. He reports hx of ADHD as a child.  Total Time spent with patient: 40 minutes  Psychiatric Specialty Exam: Physical Exam  Constitutional: He is oriented to person, place, and time. He appears well-developed.  HENT:  Head: Normocephalic.  Eyes: Pupils are equal, round, and reactive to light.  Neck: Normal range of motion.  Cardiovascular: Normal rate.   Respiratory: Effort normal.  GI: Soft.  Genitourinary:  Genitourinary Comments: Denies any issues in this area.  Musculoskeletal: Normal range of motion.  Neurological: He is alert and oriented to person, place, and time.  Skin: Skin is dry.    Review of Systems  Constitutional: Negative.   HENT: Negative.   Eyes: Negative.   Respiratory: Negative.   Cardiovascular: Negative.   Gastrointestinal: Negative.   Genitourinary: Negative.   Skin: Negative.   Neurological: Negative.   Endo/Heme/Allergies: Negative.   Psychiatric/Behavioral: Positive for depression, substance abuse (Hx. THC/tobacco use disorder) and suicidal ideas. Negative for hallucinations and memory loss. The patient is nervous/anxious and has insomnia.     There were no vitals  taken for this visit.There is no height or weight on file to calculate BMI.  General Appearance: Casual  Eye Contact:  Fair  Speech:  Clear and Coherent and Slow  Volume:  Decreased  Mood:  Depressed  Affect:  Flat  Thought Process:  Coherent, Linear and Descriptions of Associations: Intact  Orientation:  Full (Time, Place, and Person)  Thought Content:  Ruminations, denies any hallucinations, delusional thoughts or paranoia.  Suicidal Thoughts:  Yes.  with intent/plan  Homicidal Thoughts:  Denies  Memory:  Immediate;   Good Recent;   Good Remote;   Good  Judgement:  Fair  Insight:  Fair  Psychomotor Activity:  Decreased  Concentration: Concentration: Good and Attention Span: Good  Recall:  Good  Fund of Knowledge:Fair  Language: Good  Akathisia:  No  Handed:  Right  AIMS (if indicated):     Assets:  Desire for Improvement  Sleep: "Poor"   Musculoskeletal: Strength & Muscle Tone: within normal limits Gait & Station: normal Patient leans: Right  T.97.8, P.69, 18, 134/63. Sat: 99%  Recommendations: Inpatient admission for psychiatric evaluation & treatment. Will however, transfer to the [facility] ED for medical clearance prior to being admitted to the [facility] adult unit.  Sanjuana Kava, NP, PMHNP, FNP-BC 02/19/2017, 4:29 PM
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
## 2017-02-20 DIAGNOSIS — F1220 Cannabis dependence, uncomplicated: Secondary | ICD-10-CM

## 2017-02-20 DIAGNOSIS — F323 Major depressive disorder, single episode, severe with psychotic features: Principal | ICD-10-CM

## 2017-02-20 LAB — LIPID PANEL
Cholesterol: 161 mg/dL (ref 0–200)
HDL: 53 mg/dL (ref >40)
LDL CALC: 81 mg/dL (ref 0–99)
TRIGLYCERIDES: 136 mg/dL (ref <150)
Total CHOL/HDL Ratio: 3 RATIO
VLDL: 27 mg/dL (ref 0–40)

## 2017-02-20 LAB — TSH: TSH: 1.867 u[IU]/mL (ref 0.350–4.500)

## 2017-02-20 MED ORDER — NICOTINE POLACRILEX 2 MG MT GUM
2.0000 mg | CHEWING_GUM | OROMUCOSAL | Status: DC | PRN
Start: 2017-02-20 — End: 2017-02-22
  Administered 2017-02-20 (×2): 2 mg via ORAL
  Filled 2017-02-20 (×2): qty 1

## 2017-02-20 MED ORDER — LORAZEPAM 1 MG PO TABS
1.0000 mg | ORAL_TABLET | Freq: Once | ORAL | Status: AC
  Administered 2017-02-20: 1 mg via ORAL
  Filled 2017-02-20: qty 1

## 2017-02-20 MED ORDER — TRAZODONE HCL 50 MG PO TABS
50.0000 mg | ORAL_TABLET | Freq: Every evening | ORAL | Status: DC | PRN
  Administered 2017-02-20 – 2017-02-22 (×2): 50 mg via ORAL
  Filled 2017-02-20 (×2): qty 1

## 2017-02-20 MED ORDER — ENSURE ENLIVE PO LIQD: 237.0000 mL | Freq: Two times a day (BID) | ORAL | Status: DC | PRN

## 2017-02-20 MED ORDER — BUPROPION HCL ER (XL) 150 MG PO TB24
150.0000 mg | ORAL_TABLET | Freq: Every day | ORAL | Status: DC
  Administered 2017-02-20 – 2017-02-26 (×7): 150 mg via ORAL
  Filled 2017-02-20 (×7): qty 1
  Filled 2017-02-20: qty 7
  Filled 2017-02-20 (×2): qty 1

## 2017-02-20 MED ORDER — OLANZAPINE 2.5 MG PO TABS
2.5000 mg | ORAL_TABLET | Freq: Every day | ORAL | Status: DC
  Filled 2017-02-20: qty 1

## 2017-02-20 NOTE — Progress Notes (Signed)
Patient ID: Todd Aguilar, male   DOB: [birth year], 21 y.o.   MRN: [id number]  DAR: Pt. Denies HI and visual Hallucinations. He reports passive SI and auditory hallucinations in the form of "whispers." He does not appear to be responding to these hallucinations at time of assessment. He reports sleep is fair, appetite is poor, energy level is low, and concentration is poor. He rates depression 7/10, hopelessness 8/10, and anxiety 4/10.  Patient does report lower back pain to this writer however refused intervention. Support and encouragement provided to the patient to come to staff with any questions or concerns. Patient reported mild chest pain with no radiation, an EKG was performed without distress. He reports some lightheadedness and dizziness upon changing positions. He is reminded of safety related to falls and dangling his feet for a moment before standing as patient's gait is unsteady already due to a neuromuscular disorder. Scheduled medication administered to patient per physician's orders. PRN Nicotine gum administered to patient as he requests. Patient reports feeling tired from lack of sleep and possibly the medications. He is seen throughout most of the day laying in bed but cooperative when called for medications. Patient is flat in affect and depressed in mood. Q15 minute checks are maintained for safety.

## 2017-02-20 NOTE — Progress Notes (Signed)
[facility] Group Notes:  (Nursing/MHT/Case Management/Adjunct)  Date:  02/20/2017  Time:  10:57 PM  Type of Therapy:  Psychoeducational Skills  Participation Level:  Minimal  Participation Quality:  Attentive  Affect:  Depressed and Flat  Cognitive:  Appropriate  Insight:  Improving  Engagement in Group:  Improving  Modes of Intervention:  Education  Summary of Progress/Problems: The patient had little to share in group except to say that his day was just "another day" and that he spoke with his doctor. His goal for tomorrow is to "think positively".   Hazle Coca S 02/20/2017, 10:57 PM

## 2017-02-20 NOTE — H&P (Addendum)
Psychiatric Admission Assessment Adult  Patient Identification: Todd Aguilar MRN:  [id number] Date of Evaluation:  02/20/2017 Chief Complaint:  " I guess I needed help"  Principal Diagnosis:  MDD, Cannabis Dependence  Diagnosis:   Patient Active Problem List   Diagnosis Date Noted  . MDD (major depressive disorder), single episode, severe with psychotic features (HCC) [F32.3] 02/19/2017   History of Present Illness:21 year old man. States he came to the hospital with his parents. States " they felt I needed help, at first I did not think I needed it but now I do". States he has been feeling depressed for a long time " years on and off". Recently he has been feeling more depressed . Reports recent stressors, including recently being kicked out of his parents' house due to smoking cannabis. States he has also had increased stress at work. Over recent days, since he left home, he has been living in his car.  Reports history of suicidal attempt by " alcohol poisoning ". States he drank heavily a couple of days ago, but denies any pattern of alcohol abuse or dependence. States he had last consumed alcohol a few months prior . Of note, patient reports long history of difficulties with attention and focus, and states he has been told he has ADHD .  Associated Signs/Symptoms: Depression Symptoms:  depressed mood, anhedonia, insomnia, suicidal thoughts with specific plan, anxiety, loss of energy/fatigue, decreased appetite,  Has lost several pounds over recent weeks. (Hypo) Manic Symptoms:  Denies  Anxiety Symptoms: reports worrying, mostly about his job, describes some symptoms of social anxiety Psychotic Symptoms:  Reports occasional hearing his name being called, but no other psychotic symptoms and does not appear internally preoccupied . PTSD Symptoms: Does not endorse  Total Time spent with patient: 45 minutes  Past Psychiatric History: no prior psychiatric admissions, He reports history of  suicide ideations and attempts,states " I have tried to starve myself, and I drank bleach" ( while in high school). History of self cutting but not frequently or recently . Denies hallucinations, except for occasionally hearing his name being called . Denies violence, except for fighting in middle and high school due to being bullied. Denies history of mania. As noted , patient reports history of ADHD symptoms . Endorses history of excessive worrying, mainly about job, which consists of administrating volunteer's issues at [employer] . Also endorses some symptoms of social anxiety.     Is the patient at risk to self? Yes.    Has the patient been a risk to self in the past 6 months? Yes.    Has the patient been a risk to self within the distant past? Yes.    Is the patient a risk to others? No.  Has the patient been a risk to others in the past 6 months? No.  Has the patient been a risk to others within the distant past? No.   Prior Inpatient Therapy: Prior Inpatient Therapy: No Prior Outpatient Therapy: Has an outpatient , Dr. Penelope Galas.  Alcohol Screening: 1. How often do you have a drink containing alcohol?: 2 to 3 times a week 2. How many drinks containing alcohol do you have on a typical day when you are drinking?: 1 or 2 3. How often do you have six or more drinks on one occasion?: Never Preliminary Score: 0 9. Have you or someone else been injured as a result of your drinking?: No 10. Has a relative or friend or a doctor or another  health worker been concerned about your drinking or suggested you cut down?: No Alcohol Use Disorder Identification Test Final Score (AUDIT): 3 Brief Intervention: AUDIT score less than 7 or less-screening does not suggest unhealthy drinking-brief intervention not indicated Substance Abuse History in the last 12 months: denies alcohol abuse, cannabis abuse 2-3 x per week. No other drug abuse .  Consequences of Substance Abuse: Denies any , other  than recent increased tension with parents and being kicked out .  Previous Psychotropic Medications: states he has never been on any antidepressants or other psychiatric medications in the past . 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 NKDA Past Medical History:  Diagnosis Date  . Anxiety   . Depression   . Muscular disease     Past Surgical History:  Procedure Laterality Date  . CLOSED REDUCTION FINGER WITH PERCUTANEOUS PINNING Left 11/25/2016   Procedure: CLOSED REDUCTION WITH PERCUTANEOUS PINNING - INDEX AND LONG FINGERS;  Surgeon: Bradly Bienenstock, MD;  Location: [facility] OR;  Service: Orthopedics;  Laterality: Left;  LEFT INDEX AND LONG FINGERS  . TONSILLECTOMY     Family History: patient was adopted at age 63, states he has no knowledge of his biological parents or family history. No biological siblings. Has 6 older siblings ( adoptive ).  Family Psychiatric  History: states he has no knowledge of mental illness in biological family, denies any known history of mental illness in adoptive family . Tobacco Screening:  Smokes 1/2 PPD  Social History: single, no children, was living with parents until recently, when he states he was kicked out of his parents' home due to them finding cannabis in his belongings, denies legal difficulties. HS graduate. Works at [employer] .  History  Alcohol Use  . 0.6 - 1.2 oz/week  . 1 - 2 Cans of beer per week     History  Drug Use  . Frequency: 2.0 times per week  . Types: Marijuana    Additional Social History: Marital status: Single Does patient have children?: No    Pain Medications: denies Prescriptions: denies Over the Counter: denies History of alcohol / drug use?: Yes Name of Substance 1: alcohol 1 - Age of First Use: [age] 1 - Amount (size/oz): varies 1 - Frequency: varies 1 - Duration: ongoing 1 - Last Use / Amount: last night/a 24  pack of beer and a 1/2 bottle of whiskey Name of Substance 2: marijuana 2 - Age of First Use: 18 2 - Amount (size/oz): varies 2 - Frequency: 3xs/week 2 - Duration: ongoing 2 - Last Use / Amount: last night  Allergies:  No Known Allergies Lab Results:  Results for orders placed or performed during the hospital encounter of 02/19/17 (from the past 48 hour(s))  TSH     Status: None   Collection Time: 02/20/17  6:25 AM  Result Value Ref Range   TSH 1.867 0.350 - 4.500 uIU/mL    Comment: Performed by a 3rd Generation assay with a functional sensitivity of <=0.01 uIU/mL. Performed at [facility], [address] [place], [place] [address]   Lipid panel     Status: None   Collection Time: 02/20/17  6:25 AM  Result Value Ref Range   Cholesterol 161 0 - 200 mg/dL   Triglycerides 528 <413 mg/dL   HDL 53 >24 mg/dL   Total CHOL/HDL Ratio 3.0 RATIO   VLDL 27 0 - 40 mg/dL  LDL Cholesterol 81 0 - 99 mg/dL    Comment:        Total Cholesterol/HDL:CHD Risk Coronary Heart Disease Risk Table                     Men   Women  1/2 Average Risk   3.4   3.3  Average Risk       5.0   4.4  2 X Average Risk   9.6   7.1  3 X Average Risk  23.4   11.0        Use the calculated Patient Ratio above and the CHD Risk Table to determine the patient's CHD Risk.        ATP III CLASSIFICATION (LDL):  <100     mg/dL   Optimal  811-914  mg/dL   Near or Above                    Optimal  130-159  mg/dL   Borderline  782-956  mg/dL   High  >213     mg/dL   Very High Performed at [facility], [address] [place], [place] [address]     Blood Alcohol level:  Lab Results  Component Value Date   ETH <5 02/19/2017    Metabolic Disorder Labs:  No results found for: HGBA1C, MPG No results found for: PROLACTIN Lab Results  Component Value Date   CHOL 161 02/20/2017   TRIG 136 02/20/2017   HDL 53 02/20/2017   CHOLHDL 3.0 02/20/2017   VLDL 27 02/20/2017   LDLCALC 81  02/20/2017    Current Medications: Current Facility-Administered Medications  Medication Dose Route Frequency Provider Last Rate Last Dose  . acetaminophen (TYLENOL) tablet 500 mg  500 mg Oral Q6H PRN Kerry Hough, PA-C      . alum & mag hydroxide-simeth (MAALOX/MYLANTA) 200-200-20 MG/5ML suspension 30 mL  30 mL Oral Q4H PRN Kerry Hough, PA-C      . feeding supplement (ENSURE ENLIVE) (ENSURE ENLIVE) liquid 237 mL  237 mL Oral BID PRN Craige Cotta, MD      . FLUoxetine (PROZAC) capsule 20 mg  20 mg Oral Daily Kerry Hough, PA-C   20 mg at 02/20/17 8469  . hydrOXYzine (ATARAX/VISTARIL) tablet 25 mg  25 mg Oral Q6H PRN Kerry Hough, PA-C      . magnesium hydroxide (MILK OF MAGNESIA) suspension 30 mL  30 mL Oral Daily PRN Kerry Hough, PA-C      . nicotine polacrilex (NICORETTE) gum 2 mg  2 mg Oral PRN Kerry Hough, PA-C   2 mg at 02/20/17 6295  . OLANZapine (ZYPREXA) tablet 5 mg  5 mg Oral QHS Kerry Hough, PA-C   5 mg at 02/19/17 2353  . traZODone (DESYREL) tablet 50 mg  50 mg Oral QHS,MR X 1 Kerry Hough, PA-C   50 mg at 02/19/17 2353   PTA Medications: Prescriptions Prior to Admission  Medication Sig Dispense Refill Last Dose  . acetaminophen (TYLENOL) 500 MG tablet Take 500-2,000 mg by mouth every 6 (six) hours as needed for moderate pain.   02/18/2017 at Unknown time  . cephALEXin (KEFLEX) 500 MG capsule Take 1 capsule (500 mg total) by mouth 4 (four) times daily. (Patient not taking: Reported on 02/19/2017) 40 capsule 0 Completed Course at Unknown time  . docusate sodium (COLACE) 100 MG capsule Take 1 capsule (100 mg total) by  mouth 2 (two) times daily. (Patient not taking: Reported on 02/19/2017) 10 capsule 0 Completed Course at Unknown time  . magnesium 30 MG tablet Take 30 mg by mouth daily.   02/18/2017 at Unknown time    Musculoskeletal: Strength & Muscle Tone: reports history of muscular dystrophy, walks slowly but steadily-  Gait & Station: walks slowly   Patient leans: N/A  Psychiatric Specialty Exam: Physical Exam  Review of Systems  Constitutional: Negative.   HENT: Negative.   Eyes: Negative.   Respiratory: Negative.   Cardiovascular: Negative.   Gastrointestinal: Positive for nausea. Negative for blood in stool.  Genitourinary: Negative.   Musculoskeletal:       Reports bilateral muscle spasms ( lower extremities ) due to history of a congenital dystrophy   Skin: Negative.   Neurological: Negative for seizures.  Endo/Heme/Allergies: Negative.   Psychiatric/Behavioral: Positive for depression, substance abuse and suicidal ideas.  All other systems reviewed and are negative.   Blood pressure (!) 114/49, pulse 87, temperature 98.5 F (36.9 C), temperature source Oral, resp. rate 16, height  (1.651 m), weight 66.7 kg (147 lb), SpO2 100 %.Body mass index is 24.46 kg/m.  General Appearance: Fairly Groomed  Eye Contact:  Fair  Speech:  Normal Rate  Volume:  Decreased  Mood:  Anxious and Depressed  Affect:  vaguely constricted  Thought Process:  Linear and Descriptions of Associations: Intact  Orientation:  Other:  fully alert and attentive  Thought Content:  no hallucinations, no delusions, not internally preoccupied, states he occasionally hears his name being called   Suicidal Thoughts:  No denies any current suicidal or self injurious ideations, contracts for safety on unit   Homicidal Thoughts:  denies any violent or homicidal ideations  Memory:  recent and remote grossly intact   Judgement:  Other:  fair   Insight:  fair   Psychomotor Activity:  Normal  Concentration:  Concentration: Good and Attention Span: Good  Recall:  Good  Fund of Knowledge:  Good  Language:  Good  Akathisia:  Negative  Handed:  Right  AIMS (if indicated):     Assets:  Communication Skills Desire for Improvement Resilience Social Support  ADL's:  Intact  Cognition:  WNL  Sleep:  Number of Hours: 6.5    Treatment Plan  Summary: Daily contact with patient to assess and evaluate symptoms and progress in treatment, Medication management, Plan inpatient treatment  and medications as below  Observation Level/Precautions:  15 minute checks  Laboratory:  as needed  Psychotherapy:  Milieu , group therapy   Medications: we discussed options. Patient was started on Prozac yesterday, but as he reports history of ADHD , and reports lack of focus , attention as significant symptoms, we discussed switching to Wellbutrin. He agrees. He has no history of seizures . Start Wellbutrin XL 150 mgrs QAM D/C Prozac  Vistaril 25 mgrs Q 6 hours PRN for anxiety Trazodone 50 mgrs QHS PRN for insomnia.  Will D/C Zyprexa- patient not endorsing any psychotic or manic symptoms at this time.     Consultations:  As needed  Discharge Concerns:  -   Estimated LOS: 5 days   Other:     Physician Treatment Plan for Primary Diagnosis:  MDD Long Term Goal(s): Improvement in symptoms so as ready for discharge  Short Term Goals: Ability to verbalize feelings will improve, Ability to disclose and discuss suicidal ideas, Ability to demonstrate self-control will improve, Ability to identify and develop effective coping behaviors will improve  and Ability to maintain clinical measurements within normal limits will improve  Physician Treatment Plan for Secondary Diagnosis:  Cannabis Dependence   Long Term Goal(s): Improvement in symptoms so as ready for discharge  Short Term Goals: Ability to identify triggers associated with substance abuse/mental health issues will improve  I certify that inpatient services furnished can reasonably be expected to improve the patient's condition.    Craige Cotta, MD 4/25/201812:08 PM

## 2017-02-20 NOTE — Progress Notes (Addendum)
Patient ID: Todd Aguilar, male   DOB: December 09, 1995, 21 y.o.   MRN: [id number]  Pt currently presents with a flat affect and cooperative behavior. Pt thought process is logical and shows insight into thought processes. Pt reports to writer that their goal is to "try to stay and think positive." Pt states "I don't have any emotions and then I find someone to take all of my emotions off on" Pt reports concern that his agitation will lead to him acting aggressively. Says that he was charged with assault in high school after "beating up" someone who bullied him. Pt reports good sleep with current medication regimen. Reports increased anxiety symptoms including racing thoughts and body tension. No SOB, ataxia noted. Blood pressure hypertensive tonight.  Pt provided with medications per providers orders. Pt's labs and vitals were monitored throughout the night. Pt given a 1:1 about emotional and mental status. Pt supported and encouraged to express concerns and questions. Pt educated on medications, CBT and alternative agitation coping skills. Provider consulted about patients high blood pressure and anxiety, see MAR.  Pt's safety ensured with 15 minute and environmental checks. Reports endorses passive SI, unable to voice plan but states "I see everything as a weapon, icnluding my body." Endorses VH last night but none currently. Pt currently denies HI and A/V hallucinations. Pt verbally agrees to seek staff if SI or VH worsens, HI or AH occurs and to consult with staff before acting on any harmful thoughts. Pt remains guarded, limited interaction with peers. Will continue POC.

## 2017-02-20 NOTE — Progress Notes (Signed)
NUTRITION ASSESSMENT  Pt identified as at risk on the Malnutrition Screen Tool  INTERVENTION: 1. Supplements: Ensure Enlive po PRN, each supplement provides 350 kcal and 20 grams of protein  NUTRITION DIAGNOSIS: Unintentional weight loss related to sub-optimal intake as evidenced by pt report.   Goal: Pt to meet >/= 90% of their estimated nutrition needs.  Monitor:  PO intake  Assessment:  Pt admitted with depression. Pt currently homeless from his parents kicking him out d/t [facility] use. Pt has been drinking ETOH recently. No PO intake x 48 hours PTA. If patient continues to eat <50% of meals, provide Ensure supplements. Will place PRN order. Pt's weight history shows a higher weight of 180 lb in January. Uncertain if this is an outlier or if patient has truly lost ~40 lb.    Height: Ht Readings from Last 1 Encounters:  02/19/17  (1.651 m)    Weight: Wt Readings from Last 1 Encounters:  02/19/17 147 lb (66.7 kg)    Weight Hx: Wt Readings from Last 10 Encounters:  02/19/17 147 lb (66.7 kg)  11/25/16 180 lb (81.6 kg)  02/28/12 141 lb (64 kg) (52 %, Z= 0.06)*  02/27/12 141 lb 9.6 oz (64.2 kg) (53 %, Z= 0.09)*   * Growth percentiles are based on CDC 2-20 Years data.    BMI:  Body mass index is 24.46 kg/m. Pt meets criteria for normal based on current BMI.  Estimated Nutritional Needs: Kcal: 25-30 kcal/kg Protein: > 1 gram protein/kg Fluid: 1 ml/kcal  Diet Order: Diet regular Room service appropriate? Yes; Fluid consistency: Thin Pt is also offered choice of unit snacks mid-morning and mid-afternoon.  Pt is eating as desired.   Lab results and medications reviewed.   Tilda Franco, MS, RD, LDN Pager: [phone] After Hours Pager: [phone]

## 2017-02-20 NOTE — Progress Notes (Signed)
Recreation Therapy Notes  Date: 02/20/17 Time: 0930 Location: [address] Dayroom  Group Topic: Stress Management  Goal Area(s) Addresses:  Patient will verbalize importance of using healthy stress management.  Patient will identify positive emotions associated with healthy stress management.   Intervention: Stress Management  Activity :  Meditation.  LRT introduced the stress management technique of meditation.  LRT played Aguilar meditation focused on resilience from the Calm app to patients to fully participate in the activity.  Patients were to follow along as the meditation played to engage in the technique.  Education:  Stress Management, Discharge Planning.   Education Outcome: Acknowledges edcuation/In group clarification offered/Needs additional education  Clinical Observations/Feedback: Pt did not attend group.   Obbie Lewallen, LRT/CTRS         Todd Aguilar 02/20/2017 11:41 AM 

## 2017-02-20 NOTE — Progress Notes (Addendum)
Patient ID: Todd Aguilar, male   DOB: 10/06/1996, 21 y.o.   MRN: [id number]  21 year old male presents to [facility] with complaints of homelessness, increased depressive symptoms and suicidal ideation. Pt affect is flat and depressed, mood is anxious. Pt reports that we has been living in his car after he was kicked out of his parents house for smoking marijuana for a third time. Pt reports tearfully that he "cannot stop thinking negatively" and "I cannot find a reason to keep going forward." Pt endorses regular marijuana use and says he drinks 1-2 beers a week regularly. Pt currently employed with [employer] and works in [employer] for [employer]. Reports that he has not been sleeping well since living in his car.   Pt forwards little to writer about past medical and psychiatric history, stories change often during assessment. Pt presents with mild confusion. Admission completed with in person interview and chart review. Gait is visibly abnormal, per chart review patient has a genetic muscular disorder. Pt reports that he has a degenerative disk disease from a football injury as a child. Endorses current constipation and cramping and reports he had a bowel movement earlier today. Patient placed as a high fall risk, educated on fall risk precautions.   Pt currently denies SI/HI and A/V hallucinations. States he is not seeing or hearing anything unusual "yet." Pt verbally agrees to seek staff if SI/HI or A/VH occurs and to consult with staff before acting on any harmful thoughts. Consents signed, skin search completed and pt oriented to unit. Pt stable at this time. Pt given the opportunity to express concerns and ask questions. Pt given toiletries. Will continue to monitor.    *Pt did not arrive at [facility] with any personal items. Per transferring ED RN, pt mother took home all personal belongings.

## 2017-02-20 NOTE — Tx Team (Signed)
Initial Treatment Plan 02/20/2017 12:08 AM Fuller Mandril [id number]    PATIENT STRESSORS: Financial difficulties Loss of housing Marital or family conflict Substance abuse Other: Acute Psychosis, Depressive Symptoms   PATIENT STRENGTHS: Average or above average intelligence Communication skills General fund of knowledge Motivation for treatment/growth Work skills   PATIENT IDENTIFIED PROBLEMS: "I just keep thinking negatively" - depression  "I can't find a reason to keep going forward" - suicidal ideation  "I have to get an apartment" - inefective coping  Substance abuse  Auditory and visual hallucinations             DISCHARGE CRITERIA:  Ability to meet basic life and health needs Improved stabilization in mood, thinking, and/or behavior Motivation to continue treatment in a less acute level of care Safe-care adequate arrangements made Verbal commitment to aftercare and medication compliance  PRELIMINARY DISCHARGE PLAN: Outpatient therapy Participate in family therapy Placement in alternative living arrangements Return to previous work or school arrangements  PATIENT/FAMILY INVOLVEMENT: This treatment plan has been presented to and reviewed with the patient, Mccartney Chuba.  The patient and family have been given the opportunity to ask questions and make suggestions.  Aurora Mask, RN 02/20/2017, 12:08 AM
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
## 2017-02-20 NOTE — [facility] Group Notes (Signed)
[facility] LCSW Group Therapy 02/20/2017 1:15 PM  Type of Therapy: Group Therapy- Emotion Regulation  Participation Level: Minimal  Participation Quality:  Attentive  Affect: Flat  Cognitive: Alert and Oriented   Insight:  Unable to assess  Engagement in Therapy: Limited   Modes of Intervention: Clarification, Confrontation, Discussion, Education, Exploration, Limit-setting, Orientation, Problem-solving, Rapport Building, Dance movement psychotherapist, Socialization and Support  Summary of Progress/Problems: The topic for group today was emotional regulation. This group focused on both positive and negative emotion identification and allowed group members to process ways to identify feelings, regulate negative emotions, and find healthy ways to manage internal/external emotions. Group members were asked to reflect on a time when their reaction to an emotion led to a negative outcome and explored how alternative responses using emotion regulation would have benefited them. Group members were also asked to discuss a time when emotion regulation was utilized when a negative emotion was experienced. Pt did not participate in group discussion.   Vernie Shanks, LCSW 02/20/2017 4:30 PM

## 2017-02-20 NOTE — [facility] Suicide Risk Assessment (Signed)
[facility] Admission Suicide Risk Assessment   Nursing information obtained from:    patient and chart  Demographic factors:   21 year old single male, currently unemployed  Current Mental Status:   see below Loss Factors:   states recently kicked out of his parents' home, unemployment, chronic muscular disorder  Historical Factors:   depression, reports history of ADHD symptoms, cannabis abuse  Risk Reduction Factors:   resilience   Total Time spent with patient: 45 minutes Principal Problem:  MDD, Cannabis Dependence Diagnosis:   Patient Active Problem List   Diagnosis Date Noted  . MDD (major depressive disorder), single episode, severe with psychotic features (HCC) [F32.3] 02/19/2017    Continued Clinical Symptoms:  Alcohol Use Disorder Identification Test Final Score (AUDIT): 3 The "Alcohol Use Disorders Identification Test", Guidelines for Use in Primary Care, Second Edition.  World Science writer [facility]). Score between 0-7:  no or low risk or alcohol related problems. Score between 8-15:  moderate risk of alcohol related problems. Score between 16-19:  high risk of alcohol related problems. Score 20 or above:  warrants further diagnostic evaluation for alcohol dependence and treatment.   CLINICAL FACTORS:  21 year old single male, presents for depression, reports significant losses , mainly being kicked out of his parents' home ( after which he has been living in his car) due to his cannabis abuse . Denies other drug or alcohol abuse . Was not on any psychiatric medications prior to admission .  Psychiatric Specialty Exam: Physical Exam  ROS  Blood pressure 114/60, pulse (!) 56, temperature 98.5 F (36.9 C), temperature source Oral, resp. rate 16, height  (1.651 m), weight 66.7 kg (147 lb), SpO2 100 %.Body mass index is 24.46 kg/m.   see admit note MSE   COGNITIVE FEATURES THAT CONTRIBUTE TO RISK:  Closed-mindedness and Loss of executive function    SUICIDE RISK:   Moderate:  Frequent suicidal ideation with limited intensity, and duration, some specificity in terms of plans, no associated intent, good self-control, limited dysphoria/symptomatology, some risk factors present, and identifiable protective factors, including available and accessible social support.  PLAN OF CARE: Patient will be admitted to inpatient psychiatric unit for stabilization and safety. Will provide and encourage milieu participation. Provide medication management and maked adjustments as needed.  Will follow daily.    I certify that inpatient services furnished can reasonably be expected to improve the patient's condition.   Craige Cotta, MD 02/20/2017, 2:23 PM

## 2017-02-20 NOTE — Tx Team (Signed)
Interdisciplinary Treatment and Diagnostic Plan Update  02/20/2017 Time of Session: 9:30am Woodward Klem MRN: [id number]  Principal Diagnosis: MDD, Cannabis Dependence   Secondary Diagnoses: Active Problems:   MDD (major depressive disorder), single episode, severe with psychotic features ([place])   Current Medications:  Current Facility-Administered Medications  Medication Dose Route Frequency Provider Last Rate Last Dose  . acetaminophen (TYLENOL) tablet 500 mg  500 mg Oral Q6H PRN Laverle Hobby, PA-C      . alum & mag hydroxide-simeth (MAALOX/MYLANTA) 200-200-20 MG/5ML suspension 30 mL  30 mL Oral Q4H PRN Laverle Hobby, PA-C      . buPROPion (WELLBUTRIN XL) 24 hr tablet 150 mg  150 mg Oral Daily Jenne Campus, MD   150 mg at 02/20/17 1445  . feeding supplement (ENSURE ENLIVE) (ENSURE ENLIVE) liquid 237 mL  237 mL Oral BID PRN Jenne Campus, MD      . hydrOXYzine (ATARAX/VISTARIL) tablet 25 mg  25 mg Oral Q6H PRN Laverle Hobby, PA-C      . magnesium hydroxide (MILK OF MAGNESIA) suspension 30 mL  30 mL Oral Daily PRN Laverle Hobby, PA-C      . nicotine polacrilex (NICORETTE) gum 2 mg  2 mg Oral PRN Laverle Hobby, PA-C   2 mg at 02/20/17 6546  . traZODone (DESYREL) tablet 50 mg  50 mg Oral QHS PRN Jenne Campus, MD        PTA Medications: Prescriptions Prior to Admission  Medication Sig Dispense Refill Last Dose  . acetaminophen (TYLENOL) 500 MG tablet Take 500-2,000 mg by mouth every 6 (six) hours as needed for moderate pain.   02/18/2017 at Unknown time  . cephALEXin (KEFLEX) 500 MG capsule Take 1 capsule (500 mg total) by mouth 4 (four) times daily. (Patient not taking: Reported on 02/19/2017) 40 capsule 0 Completed Course at Unknown time  . docusate sodium (COLACE) 100 MG capsule Take 1 capsule (100 mg total) by mouth 2 (two) times daily. (Patient not taking: Reported on 02/19/2017) 10 capsule 0 Completed Course at Unknown time  . magnesium 30 MG tablet Take 30 mg by  mouth daily.   02/18/2017 at Unknown time    Treatment Modalities: Medication Management, Group therapy, Case management,  1 to 1 session with clinician, Psychoeducation, Recreational therapy.  Patient Stressors: Financial difficulties Loss of housing Marital or family conflict Substance abuse Other: Acute Psychosis, Depressive Symptoms  Patient Strengths: Average or above average intelligence Curator fund of knowledge Motivation for treatment/growth Work Artist for Primary Diagnosis: MDD, Cannabis Dependence  Long Term Goal(s): Improvement in symptoms so as ready for discharge  Short Term Goals: Ability to verbalize feelings will improve Ability to disclose and discuss suicidal ideas Ability to demonstrate self-control will improve Ability to identify and develop effective coping behaviors will improve Ability to maintain clinical measurements within normal limits will improve Ability to identify triggers associated with substance abuse/mental health issues will improve  Medication Management: Evaluate patient's response, side effects, and tolerance of medication regimen.  Therapeutic Interventions: 1 to 1 sessions, Unit Group sessions and Medication administration.  Evaluation of Outcomes: Not Met  Physician Treatment Plan for Secondary Diagnosis: Active Problems:   MDD (major depressive disorder), single episode, severe with psychotic features ([place])   Long Term Goal(s): Improvement in symptoms so as ready for discharge  Short Term Goals: Ability to verbalize feelings will improve Ability to disclose and discuss suicidal ideas Ability to demonstrate self-control  will improve Ability to identify and develop effective coping behaviors will improve Ability to maintain clinical measurements within normal limits will improve Ability to identify triggers associated with substance abuse/mental health issues will  improve  Medication Management: Evaluate patient's response, side effects, and tolerance of medication regimen.  Therapeutic Interventions: 1 to 1 sessions, Unit Group sessions and Medication administration.  Evaluation of Outcomes: Not Met   RN Treatment Plan for Primary Diagnosis: MDD, Cannabis Dependence  Long Term Goal(s): Knowledge of disease and therapeutic regimen to maintain health will improve  Short Term Goals: Ability to verbalize feelings will improve, Ability to disclose and discuss suicidal ideas and Ability to identify and develop effective coping behaviors will improve  Medication Management: RN will administer medications as ordered by provider, will assess and evaluate patient's response and provide education to patient for prescribed medication. RN will report any adverse and/or side effects to prescribing provider.  Therapeutic Interventions: 1 on 1 counseling sessions, Psychoeducation, Medication administration, Evaluate responses to treatment, Monitor vital signs and CBGs as ordered, Perform/monitor CIWA, COWS, AIMS and Fall Risk screenings as ordered, Perform wound care treatments as ordered.  Evaluation of Outcomes: Not Met   LCSW Treatment Plan for Primary Diagnosis: MDD, Cannabis Dependence  Long Term Goal(s): Safe transition to appropriate next level of care at discharge, Engage patient in therapeutic group addressing interpersonal concerns.  Short Term Goals: Engage patient in aftercare planning with referrals and resources, Identify triggers associated with mental health/substance abuse issues and Increase skills for wellness and recovery  Therapeutic Interventions: Assess for all discharge needs, 1 to 1 time with Social worker, Explore available resources and support systems, Assess for adequacy in community support network, Educate family and significant other(s) on suicide prevention, Complete Psychosocial Assessment, Interpersonal group  therapy.  Evaluation of Outcomes: Not Met   Progress in Treatment: Attending groups: Pt is new to milieu, continuing to assess  Participating in groups: Pt is new to milieu, continuing to assess  Taking medication as prescribed: Yes, MD continues to assess for medication changes as needed Toleration medication: Yes, no side effects reported at this time Family/Significant other contact made: Yes with mother Patient understands diagnosis: Continuing to assess Discussing patient identified problems/goals with staff: Yes Medical problems stabilized or resolved: Yes Denies suicidal/homicidal ideation: Pt recently admitted with SI Issues/concerns per patient self-inventory: None Other: N/A  New problem(s) identified: None identified at this time.   New Short Term/Long Term Goal(s): None identified at this time.   Discharge Plan or Barriers:   Reason for Continuation of Hospitalization: Anxiety Depression Medication stabilization Suicidal ideation  Estimated Length of Stay: 3-5 days  Attendees: Patient: 02/20/2017  4:26 PM  Physician: Dr. Parke Poisson 02/20/2017  4:26 PM  Nursing: Georgiann Cocker  02/20/2017  4:26 PM  RN Care Manager: Lars Pinks, RN 02/20/2017  4:26 PM  Social Worker: Adriana Reams, LCSW 02/20/2017  4:26 PM  Recreational Therapist:  02/20/2017  4:26 PM  Other: Lindell Spar, NP; Samuel Jester, NP 02/20/2017  4:26 PM  Other:  02/20/2017  4:26 PM  Other: 02/20/2017  4:26 PM    Scribe for Treatment Team: Gladstone Lighter, LCSW 02/20/2017 4:26 PM
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
## 2017-02-20 NOTE — [facility] Group Notes (Signed)
[facility] LCSW Aftercare Discharge Planning Group Note  02/20/2017 8:45 AM  Pt did not attend, declined invitation.   Imran Nuon, LCSW 02/20/2017 9:50 AM  

## 2017-02-21 LAB — PROLACTIN: Prolactin: 35.4 ng/mL — ABNORMAL HIGH (ref 4.0–15.2)

## 2017-02-21 LAB — HEMOGLOBIN A1C
HEMOGLOBIN A1C: 4.9 % (ref 4.8–5.6)
Mean Plasma Glucose: 94 mg/dL

## 2017-02-21 NOTE — Progress Notes (Signed)
Psychoeducational Group Note  Date:  02/21/2017 Time:  2229  Group Topic/Focus:  Wrap-Up Group:   The focus of this group is to help patients review their daily goal of treatment and discuss progress on daily workbooks.  Participation Level: Did Not Attend  Participation Quality:  Not Applicable  Affect:  Not Applicable  Cognitive:  Not Applicable  Insight:  Not Applicable  Engagement in Group: Not Applicable  Additional Comments:  The patient did not attend the Karaoke group since he elected to remain in his room.   Hazle Coca S 02/21/2017, 10:29 PM

## 2017-02-21 NOTE — Progress Notes (Addendum)
[facility] MD Progress Note  02/21/2017 5:00 PM Todd Aguilar  MRN:  [id number] Subjective: patient reports he is feeling slightly better, but continues to ruminate about his family stressors. States he has a history of getting into trouble with his parents, and feels guilty about this, as he wants to demonstrate to them and to himself that he can be productive and successful. States that they mostly worry about and criticize him about low motivation, procrastination, which he acknowledges could be related to cannabis. At this time , however, he is future oriented, and focusing on returning to work soon. He is worried about disposition options, as he states he had been kicked out of his parents' home recently and had been living in his car, and does not think he will be able to return home at discharge. Does state, however, that his parents visited last evening and are supportive .  Objective : I have discussed case with treatment team and have met with patient. Presents vaguely depressed, anxious, with constricted affect. At this time denies any suicidal ideations, and is future oriented. As per chart notes has endorsed passive SI.  Ruminative about family stressors and strain with parents.  He also reports history of being bullied mostly in middle school, which has resulted in some social anxiety, and feeling guarded and self conscious around people he does not know well. At this time denies any violent or homicidal ideations. Denies medication side effects thus far.  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 He walls slowly, shuffling gait, but is steady and denies falls . No disruptive or agitated behaviors on unit   Principal Problem: MDD, Cannabis Abuse Diagnosis:   Patient Active Problem List   Diagnosis Date Noted  . MDD (major depressive  disorder), single episode, severe with psychotic features ([place]) [F32.3] 02/19/2017   Total Time spent with patient: 20 minutes Past Medical History:  Past Medical History:  Diagnosis Date  . Anxiety   . Depression   . Muscular disease     Past Surgical History:  Procedure Laterality Date  . CLOSED REDUCTION FINGER WITH PERCUTANEOUS PINNING Left 11/25/2016   Procedure: CLOSED REDUCTION WITH PERCUTANEOUS PINNING - INDEX AND LONG FINGERS;  Surgeon: Iran Planas, MD;  Location: [place];  Service: Orthopedics;  Laterality: Left;  LEFT INDEX AND LONG FINGERS  . TONSILLECTOMY     Family History: History reviewed. No pertinent family history.  Social History:  History  Alcohol Use  . 0.6 - 1.2 oz/week  . 1 - 2 Cans of beer per week     History  Drug Use  . Frequency: 2.0 times per week  . Types: Marijuana    Social History   Social History  . Marital status: Single    Spouse name: N/A  . Number of children: N/A  . Years of education: N/A   Social History Main Topics  . Smoking status: Heavy Tobacco Smoker    Packs/day: 0.50    Types: Cigarettes  . Smokeless tobacco: Former Systems developer    Types: Chew  . Alcohol use 0.6 - 1.2 oz/week    1 - 2 Cans of beer per week  . Drug use: Yes    Frequency: 2.0 times per week    Types: Marijuana  . Sexual activity: Not Asked   Other Topics Concern  . None   Social History Narrative  .  None   Additional Social History:    Pain Medications: denies Prescriptions: denies Over the Counter: denies History of alcohol / drug use?: Yes Name of Substance 1: alcohol 1 - Age of First Use: [age] 1 - Amount (size/oz): varies 1 - Frequency: varies 1 - Duration: ongoing 1 - Last Use / Amount: last night/a 24 pack of beer and a 1/2 bottle of whiskey Name of Substance 2: marijuana 2 - Age of First Use: 18 2 - Amount (size/oz): varies 2 - Frequency: 3xs/week 2 - Duration: ongoing 2 - Last Use / Amount: last night  Sleep: Fair  Appetite:   Fair  Current Medications: Current Facility-Administered Medications  Medication Dose Route Frequency Provider Last Rate Last Dose  . acetaminophen (TYLENOL) tablet 500 mg  500 mg Oral Q6H PRN Laverle Hobby, PA-C   500 mg at 02/21/17 1651  . alum & mag hydroxide-simeth (MAALOX/MYLANTA) 200-200-20 MG/5ML suspension 30 mL  30 mL Oral Q4H PRN Laverle Hobby, PA-C      . buPROPion (WELLBUTRIN XL) 24 hr tablet 150 mg  150 mg Oral Daily Jenne Campus, MD   150 mg at 02/21/17 3299  . feeding supplement (ENSURE ENLIVE) (ENSURE ENLIVE) liquid 237 mL  237 mL Oral BID PRN Jenne Campus, MD      . hydrOXYzine (ATARAX/VISTARIL) tablet 25 mg  25 mg Oral Q6H PRN Laverle Hobby, PA-C   25 mg at 02/21/17 1651  . magnesium hydroxide (MILK OF MAGNESIA) suspension 30 mL  30 mL Oral Daily PRN Laverle Hobby, PA-C      . nicotine polacrilex (NICORETTE) gum 2 mg  2 mg Oral PRN Laverle Hobby, PA-C   2 mg at 02/20/17 1824  . traZODone (DESYREL) tablet 50 mg  50 mg Oral QHS PRN Jenne Campus, MD   50 mg at 02/20/17 2155    Lab Results:  Results for orders placed or performed during the hospital encounter of 02/19/17 (from the past 48 hour(s))  TSH     Status: None   Collection Time: 02/20/17  6:25 AM  Result Value Ref Range   TSH 1.867 0.350 - 4.500 uIU/mL    Comment: Performed by a 3rd Generation assay with a functional sensitivity of <=0.01 uIU/mL. Performed at [facility], [place] [address] [place], [place] [address]   Prolactin     Status: Abnormal   Collection Time: 02/20/17  6:25 AM  Result Value Ref Range   Prolactin 35.4 (H) 4.0 - 15.2 ng/mL    Comment: (NOTE) Performed At: [facility] [place], [place] [id number] Lindon Romp MD [id number] Performed at [facility], [place] [address], [place], [place] [address]   Lipid panel     Status: None   Collection Time: 02/20/17  6:25 AM  Result Value Ref Range   Cholesterol  161 0 - 200 mg/dL   Triglycerides 136 <150 mg/dL   HDL 53 >40 mg/dL   Total CHOL/HDL Ratio 3.0 RATIO   VLDL 27 0 - 40 mg/dL   LDL Cholesterol 81 0 - 99 mg/dL    Comment:        Total Cholesterol/HDL:CHD Risk Coronary Heart Disease Risk Table                     Men   Women  1/2 Average Risk   3.4   3.3  Average Risk       5.0  4.4  2 X Average Risk   9.6   7.1  3 X Average Risk  23.4   11.0        Use the calculated Patient Ratio above and the CHD Risk Table to determine the patient's CHD Risk.        ATP III CLASSIFICATION (LDL):  <100     mg/dL   Optimal  100-129  mg/dL   Near or Above                    Optimal  130-159  mg/dL   Borderline  160-189  mg/dL   High  >190     mg/dL   Very High Performed at [place] [address], [place], [place] [address]   Hemoglobin A1c     Status: None   Collection Time: 02/20/17  6:25 AM  Result Value Ref Range   Hgb A1c MFr Bld 4.9 4.8 - 5.6 %    Comment: (NOTE)         Pre-diabetes: 5.7 - 6.4         Diabetes: >6.4         Glycemic control for adults with diabetes: <7.0    Mean Plasma Glucose 94 mg/dL    Comment: (NOTE) Performed At: [facility] [place], [place] [id number] Lindon Romp MD [id number] Performed at [facility], [place] [address], [place], [place] [address]     Blood Alcohol level:  Lab Results  Component Value Date   ETH <5 04/21/7627    Metabolic Disorder Labs: Lab Results  Component Value Date   HGBA1C 4.9 02/20/2017   MPG 94 02/20/2017   Lab Results  Component Value Date   PROLACTIN 35.4 (H) 02/20/2017   Lab Results  Component Value Date   CHOL 161 02/20/2017   TRIG 136 02/20/2017   HDL 53 02/20/2017   CHOLHDL 3.0 02/20/2017   VLDL 27 02/20/2017   LDLCALC 81 02/20/2017    Physical Findings: AIMS:  , ,  ,  ,    CIWA:    COWS:     Musculoskeletal: Strength & Muscle Tone: within normal limits Gait & Station: normal- affected  by lower extremity hypertonia  Patient leans: N/A  Psychiatric Specialty Exam: Physical Exam  ROS-  Denies headache, no chest pain, no shortness of breath, no vomiting   Blood pressure 131/62, pulse 60, temperature 97.5 F (36.4 C), temperature source Oral, resp. rate 18, height 5' 5"  (1.651 m), weight 66.7 kg (147 lb), SpO2 100 %.Body mass index is 24.46 kg/m.  General Appearance: Well Groomed  Eye Contact:  Good  Speech:  Normal Rate  Volume:  Normal  Mood:  depressed  Affect:  mildly constricted, but reactive   Thought Process:  Linear and Descriptions of Associations: Intact  Orientation:  Full (Time, Place, and Person)  Thought Content:  denies hallucinations, no delusions, ruminative about stressors  Suicidal Thoughts:  No denies any current suicidal plan or intentions , denies any violent or homicidal ideations, and specifically also denies any homicidal ideations towards his parents   Homicidal Thoughts:  No  Memory:  recent and remote grossly intact   Judgement:  Fair- improving   Insight:  Fair- improving   Psychomotor Activity:  Normal  Concentration:  Concentration: Good and Attention Span: Good  Recall:  Good  Fund of Knowledge:  Good  Language:  Good  Akathisia:  Negative  Handed:  Right  AIMS (if indicated):     Assets:  Communication Skills Desire for Improvement Resilience  ADL's:  Intact  Cognition:  WNL  Sleep:  Number of Hours: 6.75   Assessment - patient reports ongoing depression, but denies suicidal ideations. Tends to ruminate about his compromised, strained relationship with his parents, and expresses a feeling of guilt for " disappointing them". He reports he has been poorly motivated recently, which could be related to depression and also to amotivation related to cannabis dependence. Thus far tolerating Wellbutrin XL trial well .    Treatment Plan Summary: Daily contact with patient to assess and evaluate symptoms and progress in treatment,  Medication management, Plan inpatient treatment  and medications as below Encourage group and milieu participation to work on coping skills and symptom reduction Encourage efforts to work on sobriety, abstinence Continue Wellbutrin XL 150 mgrs QDAY for depression and for ADHD symptoms Continue Trazodone 50 mgrs QHS PRN for insomnia as needed  Continue Vistaril 25 mgrs Q 6 hours PRN for anxiety as needed  Encouraged patient to consider a family meeting with parents prior to discharge   Jenne Campus, MD 02/21/2017, 5:00 PM

## 2017-02-21 NOTE — [facility] Group Notes (Signed)
[facility] Group Notes:  (Nursing/MHT/Case Management/Adjunct)  Date:  02/21/2017  Time:  0900  Type of Therapy:  Nurse Education  Participation Level:  Did Not Attend  Participation Quality:    Affect:    Cognitive:    Insight:    Engagement in Group:    Modes of Intervention:    Summary of Progress/Problems: Patient invited however elected to remain in bed.  Merian Capron [facility] 02/21/2017, [phone]

## 2017-02-21 NOTE — [facility] Group Notes (Signed)
[facility] LCSW Group Therapy 02/21/2017 1:15pm  Type of Therapy: Group Therapy- Balance in Life  Pt did not attend, declined invitation.   Dwight Burdo, LCSW 02/21/2017 3:18 PM  

## 2017-02-21 NOTE — Progress Notes (Signed)
Patient ID: Todd Aguilar, male   DOB: 1996/03/27, 21 y.o.   MRN: [id number] D: client spends the shift in his room, poor interaction, "yes" "no" at one point elective mutism.  A:Writer encouraged conversation, made client aware he would like to talk she was available. Offered food/drink and support. Staff will monitor q49min for safety. R: Client is safe on the unit, did not attend karaoke.

## 2017-02-21 NOTE — Progress Notes (Signed)
D: Pt A & O X3 on initial approach in hall. Visible in milieu at intervals during shift. Presents with flat affect and irritable mood. Appears preoccupied. Denies SI "not at this moment", HI and VH. Pt reports +AH "I keep hearing my name being called". Observed shaking his head at medication window, replied "it's the voices" when asked by Clinical research associate.  Off unit for meals with peers, returned without issues. Pt's gait is slow and shuffle when ambulating. Falls risk maintained without incident thus far this shift.  A: Scheduled and PRN medications administered as prescribed, effects monitored. Support and availability provided to lt. Encouraged pt to voice concerns, attend scheduled unit groups / activities. Q 15 minutes safety checks continues without incidents. R: Pt did not attend unit groups as scheduled despite encouragement. Compliant with medications when offered. Denies adverse drug reactions. Tolerated all PO intake well. POC remains effective for safety and mood stability.

## 2017-02-22 MED ORDER — NICOTINE 14 MG/24HR TD PT24
MEDICATED_PATCH | TRANSDERMAL | Status: AC
  Administered 2017-02-22: 12:00:00
  Filled 2017-02-22: qty 1

## 2017-02-22 MED ORDER — NICOTINE 14 MG/24HR TD PT24
14.0000 mg | MEDICATED_PATCH | Freq: Every day | TRANSDERMAL | Status: DC
  Administered 2017-02-22 – 2017-02-26 (×4): 14 mg via TRANSDERMAL
  Filled 2017-02-22 (×7): qty 1

## 2017-02-22 NOTE — [facility] Group Notes (Signed)
[facility] LCSW Group Therapy 02/22/2017 1:15pm  Type of Therapy: Group Therapy- Feelings Around Relapse and Recovery  Participation Level: Active   Participation Quality:  Appropriate  Affect:  Appropriate  Cognitive: Alert and Oriented   Insight:  Developing   Engagement in Therapy: Developing/Improving and Engaged   Modes of Intervention: Clarification, Confrontation, Discussion, Education, Exploration, Limit-setting, Orientation, Problem-solving, Rapport Building, Dance movement psychotherapist, Socialization and Support  Summary of Progress/Problems: The topic for today was feelings about relapse. The group discussed what relapse prevention is to them and identified triggers that they are on the path to relapse. Members also processed their feeling towards relapse and were able to relate to common experiences. Group also discussed coping skills that can be used for relapse prevention. Pt states that the people he hangs out with will be the biggest barrier to his recovery. He plans to cut off the negative people from his life and surround himself with more positive people.   Therapeutic Modalities:   Cognitive Behavioral Therapy Solution-Focused Therapy Assertiveness Training Relapse Prevention Therapy    Jonathon Jordan, MSW, Theresia Majors [phone] 02/22/2017 3:24 PM

## 2017-02-22 NOTE — Progress Notes (Signed)
[facility] MD Progress Note  02/22/2017 6:37 PM Todd Aguilar  MRN:  [id number] Subjective: patient reports feeling better today. He is mildly anxious, particularly about where he is going to go after discharge. He is future oriented, and states he plans to continue his job after discharge, and see if he can afford a place to live independently . Denies medication side effects.  Denies any suicidal ideations.  Objective : I have discussed case with treatment team and have met with patient. Patient is presenting improved compared to admission, with a partially improved mood and range of affect. He smiles at times appropriately and his overall demeanor is more relaxed, better related . With his express consent I spoke with his mother via phone- mother provided collateral information. States that patient is generally a kind and well meaning person, and states he does not have history of violence. She states he does have history of disobeying home regulations, neglecting responsibilities, and that they found he had been smoking cannabis. States that he was teased and bullied a lot while in middle school mostly , due to his gait disorder /muscular disease, and that she feels he has had difficulty overcoming this and tends to suffer from low self esteem. Patient is not currently endorsing any significant PTSD type symptoms, but does report some social anxiety, and low self esteem. States that work has helped him feel better about himself. Denies any SI. Of note, he has reported intermittent auditory hallucinations, hearing his name being called. I have reviewed this with patient and he states it was occurring prior to admission, but not since- at this time no psychotic symptoms, does not appear internally preoccupied. Denies medication side effects.    Principal Problem: MDD, Cannabis Abuse Diagnosis:   Patient Active Problem List   Diagnosis Date Noted  . MDD (major depressive disorder), single episode, severe  with psychotic features ([place]) [F32.3] 02/19/2017   Total Time spent with patient: 25 minutes  Past Medical History:  Past Medical History:  Diagnosis Date  . Anxiety   . Depression   . Muscular disease     Past Surgical History:  Procedure Laterality Date  . CLOSED REDUCTION FINGER WITH PERCUTANEOUS PINNING Left 11/25/2016   Procedure: CLOSED REDUCTION WITH PERCUTANEOUS PINNING - INDEX AND LONG FINGERS;  Surgeon: Iran Planas, MD;  Location: [place];  Service: Orthopedics;  Laterality: Left;  LEFT INDEX AND LONG FINGERS  . TONSILLECTOMY     Family History: History reviewed. No pertinent family history.  Social History:  History  Alcohol Use  . 0.6 - 1.2 oz/week  . 1 - 2 Cans of beer per week     History  Drug Use  . Frequency: 2.0 times per week  . Types: Marijuana    Social History   Social History  . Marital status: Single    Spouse name: N/A  . Number of children: N/A  . Years of education: N/A   Social History Main Topics  . Smoking status: Heavy Tobacco Smoker    Packs/day: 0.50    Types: Cigarettes  . Smokeless tobacco: Former Systems developer    Types: Chew  . Alcohol use 0.6 - 1.2 oz/week    1 - 2 Cans of beer per week  . Drug use: Yes    Frequency: 2.0 times per week    Types: Marijuana  . Sexual activity: Not Asked   Other Topics Concern  . None   Social History Narrative  . None   Additional Social  History:    Pain Medications: denies Prescriptions: denies Over the Counter: denies History of alcohol / drug use?: Yes Name of Substance 1: alcohol 1 - Age of First Use: [age] 1 - Amount (size/oz): varies 1 - Frequency: varies 1 - Duration: ongoing 1 - Last Use / Amount: last night/a 24 pack of beer and a 1/2 bottle of whiskey Name of Substance 2: marijuana 2 - Age of First Use: 18 2 - Amount (size/oz): varies 2 - Frequency: 3xs/week 2 - Duration: ongoing 2 - Last Use / Amount: last night  Sleep: Good  Appetite:  Good  Current  Medications: Current Facility-Administered Medications  Medication Dose Route Frequency Provider Last Rate Last Dose  . acetaminophen (TYLENOL) tablet 500 mg  500 mg Oral Q6H PRN Laverle Hobby, PA-C   500 mg at 02/21/17 1651  . alum & mag hydroxide-simeth (MAALOX/MYLANTA) 200-200-20 MG/5ML suspension 30 mL  30 mL Oral Q4H PRN Laverle Hobby, PA-C      . buPROPion (WELLBUTRIN XL) 24 hr tablet 150 mg  150 mg Oral Daily Jenne Campus, MD   150 mg at 02/22/17 0915  . feeding supplement (ENSURE ENLIVE) (ENSURE ENLIVE) liquid 237 mL  237 mL Oral BID PRN Jenne Campus, MD      . hydrOXYzine (ATARAX/VISTARIL) tablet 25 mg  25 mg Oral Q6H PRN Laverle Hobby, PA-C   25 mg at 02/21/17 1651  . magnesium hydroxide (MILK OF MAGNESIA) suspension 30 mL  30 mL Oral Daily PRN Laverle Hobby, PA-C      . nicotine (NICODERM CQ - dosed in mg/24 hours) patch 14 mg  14 mg Transdermal Daily Jenne Campus, MD   14 mg at 02/22/17 1215  . traZODone (DESYREL) tablet 50 mg  50 mg Oral QHS PRN Jenne Campus, MD   50 mg at 02/20/17 2155    Lab Results:  No results found for this or any previous visit (from the past 89 hour(s)).  Blood Alcohol level:  Lab Results  Component Value Date   ETH <5 89/38/1017    Metabolic Disorder Labs: Lab Results  Component Value Date   HGBA1C 4.9 02/20/2017   MPG 94 02/20/2017   Lab Results  Component Value Date   PROLACTIN 35.4 (H) 02/20/2017   Lab Results  Component Value Date   CHOL 161 02/20/2017   TRIG 136 02/20/2017   HDL 53 02/20/2017   CHOLHDL 3.0 02/20/2017   VLDL 27 02/20/2017   LDLCALC 81 02/20/2017    Physical Findings: AIMS:  , ,  ,  ,    CIWA:    COWS:     Musculoskeletal: Strength & Muscle Tone: within normal limits Gait & Station: shuffle- affected by lower extremity hypertonia  Patient leans: N/A  Psychiatric Specialty Exam: Physical Exam  ROS-  Denies falls, no chest pain, no shortness of breath  Blood pressure 125/76, pulse  (!) 59, temperature 97.9 F (36.6 C), temperature source Oral, resp. rate 16, height 5' 5"  (1.651 m), weight 66.7 kg (147 lb), SpO2 100 %.Body mass index is 24.46 kg/m.  General Appearance: Neat  Eye Contact:  Good  Speech:  Normal Rate  Volume:  Normal  Mood:  improving mood, still depressed, but feeling better   Affect:  more reactive today   Thought Process:  Goal Directed and Descriptions of Associations: Intact  Orientation:  Full (Time, Place, and Person)  Thought Content:  denies hallucinations at this time and is not  internally preoccupied   Suicidal Thoughts:  No denies any suicidal or self injurious ideations    Homicidal Thoughts:  No  Memory:  recent and remote grossly intact   Judgement:  Other:  improving    Insight:  improving   Psychomotor Activity:  Normal  Concentration:  Concentration: Good and Attention Span: Good  Recall:  Good  Fund of Knowledge:  Good  Language:  Good  Akathisia:  No  Handed:  Right  AIMS (if indicated):     Assets:  Desire for Improvement Resilience  ADL's:  Intact  Cognition:  WNL  Sleep:  Number of Hours: 6.75   Assessment - patient currently improved compared to admission. Presenting with improving range of affect. Some anxiety regarding disposition planning, but stating he is hoping to be able to live independently, with the income he makes at his work. Gaining insight into the negative impact cannabis abuse has had on his relationships , functioning. No hallucinations since admission, no current psychotic symptoms. Thus far tolerating Wellbutrin well, which was started for depression and for ADHD symptoms.    Treatment Plan Summary: Daily contact with patient to assess and evaluate symptoms and progress in treatment, Medication management, Plan inpatient treatment  and medications as below  Treatment plan reviewed as below today 4/27  Encourage group and milieu participation to work on coping skills and symptom reduction Encourage  efforts to work on sobriety, abstinence Continue Wellbutrin XL 150 mgrs QDAY for depression and for ADHD symptoms Continue Trazodone 50 mgrs QHS PRN for insomnia as needed  Continue Vistaril 25 mgrs Q 6 hours PRN for anxiety as needed  Family meeting scheduled for Monday   Jenne Campus, MD 02/22/2017, 6:37 PMPatient ID: Todd Aguilar, male   DOB: 12-25-1995, 21 y.o.   MRN: [id number]

## 2017-02-22 NOTE — [facility] Group Notes (Signed)
[facility] LCSW Aftercare Discharge Planning Group Note   02/22/2017  8:45 AM  Participation Quality:  Pt invited. Did not attend.  Jonathon Jordan, MSW, LCSWA 02/22/2017 3:24 PM

## 2017-02-22 NOTE — Progress Notes (Signed)
Todd Aguilar is seen out on the 400 hall today..he tolerates this well... He is pleasant and cooperative upon approach. He interacts with his peers appropriately and he completes his daily assessnment and on this he writes he denies SI today and he rates his depression, hopelessness and anxeity " 0/0/0/", respectively. R Safety in place;. He is unsure where he is going at DC.

## 2017-02-22 NOTE — Progress Notes (Signed)
Patient attended group and said his day was a 7. One exciting thing that happened today was that he went outside and did some exercises.

## 2017-02-23 DIAGNOSIS — F1290 Cannabis use, unspecified, uncomplicated: Secondary | ICD-10-CM

## 2017-02-23 DIAGNOSIS — F17210 Nicotine dependence, cigarettes, uncomplicated: Secondary | ICD-10-CM

## 2017-02-23 MED ORDER — TRAZODONE HCL 50 MG PO TABS
50.0000 mg | ORAL_TABLET | Freq: Every evening | ORAL | Status: DC | PRN
  Administered 2017-02-23 – 2017-02-25 (×6): 50 mg via ORAL
  Filled 2017-02-23 (×8): qty 1
  Filled 2017-02-23: qty 14
  Filled 2017-02-23: qty 1
  Filled 2017-02-23: qty 14
  Filled 2017-02-23: qty 1

## 2017-02-23 NOTE — Progress Notes (Signed)
[facility] MD Progress Note  02/23/2017 2:27 PM Todd Aguilar  MRN:  [id number]  Subjective: Patient reports " I am mildly anxious today and I am not sleeping well at night."  Objective : Todd Aguilar is awake, alert and oriented *3.  Seen attending group session.  Denies suicidal or homicidal ideation during this assessment. Denies auditory or visual hallucination and does not appear to be responding to internal stimuli. Patient reports increased anxiety. Patient reports he is medication compliant without mediation side effects.States his depression 0/10. Reports learning new coping skills to meditate and write . Support, encouragement and reassurance was provided.     Principal Problem: MDD, Cannabis Abuse Diagnosis:   Patient Active Problem List   Diagnosis Date Noted  . 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 Anxiety   . Depression   . Muscular disease     Past Surgical History:  Procedure Laterality Date  . CLOSED REDUCTION FINGER WITH PERCUTANEOUS PINNING Left 11/25/2016   Procedure: CLOSED REDUCTION WITH PERCUTANEOUS PINNING - INDEX AND LONG FINGERS;  Surgeon: Bradly Bienenstock, MD;  Location: [facility] OR;  Service: Orthopedics;  Laterality: Left;  LEFT INDEX AND LONG FINGERS  . TONSILLECTOMY     Family History: History reviewed. No pertinent family history.  Social History:  History  Alcohol Use  . 0.6 - 1.2 oz/week  . 1 - 2 Cans of beer per week     History  Drug Use  . Frequency: 2.0 times per week  . Types: Marijuana    Social History   Social History  . Marital status: Single    Spouse name: N/A  . Number of children: N/A  . Years of education: N/A   Social History Main Topics  . Smoking status: Heavy Tobacco Smoker    Packs/day: 0.50    Types: Cigarettes  . Smokeless tobacco: Former Neurosurgeon    Types:  Chew  . Alcohol use 0.6 - 1.2 oz/week    1 - 2 Cans of beer per week  . Drug use: Yes    Frequency: 2.0 times per week    Types: Marijuana  . Sexual activity: Not Asked   Other Topics Concern  . None   Social History Narrative  . None   Additional Social History:    Pain Medications: denies Prescriptions: denies Over the Counter: denies History of alcohol / drug use?: Yes Name of Substance 1: alcohol 1 - Age of First Use: [age] 1 - Amount (size/oz): varies 1 - Frequency: varies 1 - Duration: ongoing 1 - Last Use / Amount: last night/a 24 pack of beer and a 1/2 bottle of whiskey Name of Substance 2: marijuana 2 - Age of First Use: 18 2 - Amount (size/oz): varies 2 - Frequency: 3xs/week 2 - Duration: ongoing 2 - Last Use / Amount: last night  Sleep: Good  Appetite:  Good  Current Medications: Current Facility-Administered Medications  Medication Dose Route Frequency Provider Last Rate Last Dose  . acetaminophen (TYLENOL) tablet 500 mg  500 mg Oral Q6H PRN Kerry Hough, PA-C   500 mg at 02/23/17 4098  . alum & mag hydroxide-simeth (MAALOX/MYLANTA) 200-200-20 MG/5ML suspension 30 mL  30 mL Oral Q4H PRN Kerry Hough, PA-C      . buPROPion (WELLBUTRIN XL) 24 hr tablet 150 mg  150 mg Oral Daily  Craige Cotta, MD   150 mg at 02/23/17 0813  . feeding supplement (ENSURE ENLIVE) (ENSURE ENLIVE) liquid 237 mL  237 mL Oral BID PRN Craige Cotta, MD      . hydrOXYzine (ATARAX/VISTARIL) tablet 25 mg  25 mg Oral Q6H PRN Kerry Hough, PA-C   25 mg at 02/23/17 4098  . magnesium hydroxide (MILK OF MAGNESIA) suspension 30 mL  30 mL Oral Daily PRN Kerry Hough, PA-C      . nicotine (NICODERM CQ - dosed in mg/24 hours) patch 14 mg  14 mg Transdermal Daily Craige Cotta, MD   14 mg at 02/23/17 0815  . traZODone (DESYREL) tablet 50 mg  50 mg Oral QHS PRN Craige Cotta, MD   50 mg at 02/22/17 2153    Lab Results:  No results found for this or any previous visit (from  the past 48 hour(s)).  Blood Alcohol level:  Lab Results  Component Value Date   ETH <5 02/19/2017    Metabolic Disorder Labs: Lab Results  Component Value Date   HGBA1C 4.9 02/20/2017   MPG 94 02/20/2017   Lab Results  Component Value Date   PROLACTIN 35.4 (H) 02/20/2017   Lab Results  Component Value Date   CHOL 161 02/20/2017   TRIG 136 02/20/2017   HDL 53 02/20/2017   CHOLHDL 3.0 02/20/2017   VLDL 27 02/20/2017   LDLCALC 81 02/20/2017    Physical Findings: AIMS:  , ,  ,  ,    CIWA:    COWS:     Musculoskeletal: Strength & Muscle Tone: within normal limits Gait & Station: shuffle- affected by lower extremity hypertonia  Patient leans: N/A  Psychiatric Specialty Exam: Physical Exam  Nursing note and vitals reviewed. Constitutional: He is oriented to person, place, and time.  Cardiovascular: Normal rate.   Neurological: He is alert and oriented to person, place, and time.  Psychiatric: He has a normal mood and affect. His behavior is normal.    Review of Systems  Psychiatric/Behavioral: Positive for depression and substance abuse.  -  Denies falls, no chest pain, no shortness of breath  Blood pressure 125/76, pulse (!) 59, temperature 97.9 F (36.6 C), temperature source Oral, resp. rate 16, height  (1.651 m), weight 66.7 kg (147 lb), SpO2 100 %.Body mass index is 24.46 kg/m.  General Appearance: Casual  Eye Contact:  Good  Speech:  Normal Rate  Volume:  Normal  Mood:  Anxious and Irritable  Affect:  Congruent  Thought Process:  Goal Directed  Orientation:  Full (Time, Place, and Person)  Thought Content:  Hallucinations: None and Rumination  Suicidal Thoughts:  No   Homicidal Thoughts:  No  Memory:  Immediate;   Fair Recent;   Fair Remote;   Fair  Judgement:  Other:  improving    Insight:  improving   Psychomotor Activity:  Normal  Concentration:  Concentration: Good and Attention Span: Good  Recall:  Good  Fund of Knowledge:  Good   Language:  Good  Akathisia:  No  Handed:  Right  AIMS (if indicated):     Assets:  Desire for Improvement Resilience Social Support  ADL's:  Intact  Cognition:  WNL  Sleep:  Number of Hours: 6.5     I agree with current treatment plan on 02/23/2017, Patient seen face-to-face for psychiatric evaluation follow-up, chart reviewed. Reviewed the information documented and agree with the treatment plan.  Treatment Plan Summary: Daily  contact with patient to assess and evaluate symptoms and progress in treatment, Medication management, Plan inpatient treatment  and medications as below   Treatment plan reviewed as below today 4/27  Encourage group and milieu participation to work on coping skills and symptom reduction Encourage efforts to work on sobriety, abstinence Continue Wellbutrin XL 150 mgrs QDAY for depression and for ADHD symptoms Adjusted Trazodone 50 mgrs QHS PRN x 1 Repeat for insomnia as needed  Continue Vistaril 25 mgrs Q 6 hours PRN for anxiety as needed  Family meeting scheduled for Monday   Oneta Rack, NP 02/23/2017, 2:27 PM

## 2017-02-23 NOTE — [facility] Group Notes (Signed)
[facility] Group Notes:  (Nursing/MHT/Case Management/Adjunct)  Date:  02/23/2017  Time:  0900 am  Type of Therapy:  Nurse Education  Participation Level:  Active  Participation Quality:  Appropriate and Attentive  Affect:  Appropriate  Cognitive:  Alert and Appropriate  Insight:  Appropriate  Engagement in Group:  Engaged  Modes of Intervention:  Support  Summary of Progress/Problems: Patient listed attentively and asked appropriate questions.    Cranford Mon 02/23/2017, 9:48 AM

## 2017-02-23 NOTE — Plan of Care (Signed)
Problem: Safety: Goal: Periods of time without injury will increase Outcome: Progressing Patient denies any thoughts of self harm.  He states he is feeling better since admission.

## 2017-02-23 NOTE — Progress Notes (Signed)
Writer has observed patient up in the dayroom interacting appropriately with peers. He attended group and participated. Writer spoke with him 1:1 and in formed him of medications scheduled and available if needed. He reports that he will be discharging on Monday so that he can get back to work. He reports that he has learned coping skills that will be helpful after discharge. Support given and safety maintained on unit with 15 min checks.

## 2017-02-23 NOTE — [facility] Group Notes (Signed)
Adult Therapy Group Note  Date:  02/23/2017  Time:  10:00-11:00AM  Group Topic/Focus: Unhealthy vs Healthy Coping Techniques  Building Self Esteem:    The focus of this group was to determine what unhealthy coping techniques typically are used or and what healthy coping techniques would be helpful in coping with various problems. Patients were guided in becoming aware of the differences between healthy and unhealthy coping techniques.  Vignettes were used to generate ideas, which led to a discussion about personal application.     Participation Level:  Active  Participation Quality:  Attentive  Affect:  Anxious  Cognitive:  Appropriate  Insight: Improving  Engagement in Group:  Lacking  Modes of Intervention:  Exercise, Discussion and Support  Additional Comments:  The patient expressed that he does "sparring" as a healthy coping technique.  He did not talk during group, but appeared to listen while he was doing something on paper (drawing or coloring?).  He did not make comments during group unless called on directly.  Ambrose Mantle, LCSW 02/23/2017   12:36 PM

## 2017-02-23 NOTE — Progress Notes (Signed)
Pt reports he is doing well and had a good day.  He denies SI/HI/AVH.  He has been observed sitting in the dayroom watching TV.  He seems to be in a positive mood, and voices no needs or concerns at the beginning of the shift.  After an incident in the dayroom later regarding the TV, he became more anxious and nervous.  He requested a sleep aid at bedtime along with Vistaril for anxiety which he was given.  He has been pleasant and appropriate.  He makes his needs known to staff.  He is still working on his discharge plans concerning where he will stay.  Support and encouragement offered.  Discharge plans are in process.  Safety maintained with q15 minute checks.

## 2017-02-23 NOTE — Progress Notes (Signed)
D: Patient is pleasant upon discharge.  Patient is sleeping fair; his appetite is good; his energy level is normal and his concentration is good.  Patient rates his depression as 2; hopelessness as a 1; anxiety as a 6.  Patient minimizes his reason for being admitted. He states he is doing better since admission.  He denies any thoughts of self harm.  He denies HI and does not appear to be responding to internal stimuli.  He is interacting well with his peers.  He is observed in the day room joking and laughing.  He appears engaged during groups and participates well.  He continues to have some anxiety which is relieved with vistaril.  He rates his physical pain as 5 and states that he has constant chronic back pain.  His goal today is to "stay focused and don't worry about other people."   A: Continue to monitor medication management and MD orders.  Safety checks completed every 15 minutes per protocol.  Offer support and encouragement as needed. R: Patient is receptive to staff; his behavior is appropriate.

## 2017-02-24 MED ORDER — IBUPROFEN 600 MG PO TABS
600.0000 mg | ORAL_TABLET | Freq: Four times a day (QID) | ORAL | Status: DC | PRN
  Administered 2017-02-24 – 2017-02-25 (×2): 600 mg via ORAL
  Filled 2017-02-24 (×2): qty 1

## 2017-02-24 NOTE — Progress Notes (Signed)
[facility] MD Progress Note  02/24/2017 4:09 PM Taygen Acklin  MRN:  [id number]  Subjective: "I feel a lot better. Still having some trouble sleeping but it's improving. Not feeling depressed today. Overall, doing much better."  Objective: Pt seen and chart reviewed. Pt is alert/oriented x4, calm, cooperative, and appropriate to situation. Pt denies suicidal/homicidal ideation and psychosis and does not appear to be responding to internal stimuli. Pt states that he still has some anxiety and racing thoughts at night, but reports that these feel like his own thoughts rather than hallucinations. Pt reports that he is responding well to medications, denying side effects."   Principal Problem: MDD (major depressive disorder), single episode, severe with psychotic features (HCC)  Diagnosis:   Patient Active Problem List   Diagnosis Date Noted  . 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 Anxiety   . Depression   . Muscular disease     Past Surgical History:  Procedure Laterality Date  . CLOSED REDUCTION FINGER WITH PERCUTANEOUS PINNING Left 11/25/2016   Procedure: CLOSED REDUCTION WITH PERCUTANEOUS PINNING - INDEX AND LONG FINGERS;  Surgeon: Bradly Bienenstock, MD;  Location: [facility] OR;  Service: Orthopedics;  Laterality: Left;  LEFT INDEX AND LONG FINGERS  . TONSILLECTOMY     Family History: History reviewed. No pertinent family history.  Social History:  History  Alcohol Use  . 0.6 - 1.2 oz/week  . 1 - 2 Cans of beer per week     History  Drug Use  . Frequency: 2.0 times per week  . Types: Marijuana    Social History   Social History  . Marital status: Single    Spouse name: N/A  . Number of children: N/A  . Years of education: N/A   Social History Main Topics  . Smoking status: Heavy Tobacco Smoker    Packs/day: 0.50     Types: Cigarettes  . Smokeless tobacco: Former Neurosurgeon    Types: Chew  . Alcohol use 0.6 - 1.2 oz/week    1 - 2 Cans of beer per week  . Drug use: Yes    Frequency: 2.0 times per week    Types: Marijuana  . Sexual activity: Not Asked   Other Topics Concern  . None   Social History Narrative  . None   Additional Social History:    Pain Medications: denies Prescriptions: denies Over the Counter: denies History of alcohol / drug use?: Yes Name of Substance 1: alcohol 1 - Age of First Use: [age] 1 - Amount (size/oz): varies 1 - Frequency: varies 1 - Duration: ongoing 1 - Last Use / Amount: last night/a 24 pack of beer and a 1/2 bottle of whiskey Name of Substance 2: marijuana 2 - Age of First Use: 18 2 - Amount (size/oz): varies 2 - Frequency: 3xs/week 2 - Duration: ongoing 2 - Last Use / Amount: last night  Sleep: Fair  Appetite:  Good  Current Medications: Current Facility-Administered Medications  Medication Dose Route Frequency Provider Last Rate Last Dose  . acetaminophen (TYLENOL) tablet 500 mg  500 mg Oral Q6H PRN Kerry Hough, PA-C   500 mg at 02/23/17 2131  . alum & mag hydroxide-simeth (MAALOX/MYLANTA) 200-200-20 MG/5ML suspension 30 mL  30 mL Oral Q4H PRN Kerry Hough, PA-C      . buPROPion (WELLBUTRIN XL) 24  hr tablet 150 mg  150 mg Oral Daily Craige Cotta, MD   150 mg at 02/24/17 0815  . feeding supplement (ENSURE ENLIVE) (ENSURE ENLIVE) liquid 237 mL  237 mL Oral BID PRN Craige Cotta, MD      . hydrOXYzine (ATARAX/VISTARIL) tablet 25 mg  25 mg Oral Q6H PRN Kerry Hough, PA-C   25 mg at 02/23/17 1610  . ibuprofen (ADVIL,MOTRIN) tablet 600 mg  600 mg Oral Q6H PRN Oneta Rack, NP   600 mg at 02/24/17 0950  . magnesium hydroxide (MILK OF MAGNESIA) suspension 30 mL  30 mL Oral Daily PRN Kerry Hough, PA-C      . nicotine (NICODERM CQ - dosed in mg/24 hours) patch 14 mg  14 mg Transdermal Daily Craige Cotta, MD   14 mg at 02/24/17 0815   . traZODone (DESYREL) tablet 50 mg  50 mg Oral QHS,MR X 1 Oneta Rack, NP   50 mg at 02/23/17 2250    Lab Results:  No results found for this or any previous visit (from the past 48 hour(s)).  Blood Alcohol level:  Lab Results  Component Value Date   ETH <5 02/19/2017    Metabolic Disorder Labs: Lab Results  Component Value Date   HGBA1C 4.9 02/20/2017   MPG 94 02/20/2017   Lab Results  Component Value Date   PROLACTIN 35.4 (H) 02/20/2017   Lab Results  Component Value Date   CHOL 161 02/20/2017   TRIG 136 02/20/2017   HDL 53 02/20/2017   CHOLHDL 3.0 02/20/2017   VLDL 27 02/20/2017   LDLCALC 81 02/20/2017    Physical Findings: AIMS:  , ,  ,  ,    CIWA:    COWS:     Musculoskeletal: Strength & Muscle Tone: within normal limits Gait & Station: shuffle-affected by lower extremity hypertonia  Patient leans: N/A  Psychiatric Specialty Exam: Physical Exam  Nursing note and vitals reviewed. Constitutional: He is oriented to person, place, and time.  Neurological: He is alert and oriented to person, place, and time.    Review of Systems  Psychiatric/Behavioral: Positive for depression and substance abuse. Negative for hallucinations and suicidal ideas. The patient is not nervous/anxious.   All other systems reviewed and are negative. -  Denies falls, no chest pain, no shortness of breath  Blood pressure 129/71, pulse 65, temperature 98.8 F (37.1 C), resp. rate 18, height  (1.651 m), weight 66.7 kg (147 lb), SpO2 100 %.Body mass index is 24.46 kg/m.  General Appearance: Casual and Fairly Groomed   Eye Contact:  Good  Speech:  Clear and Coherent and Normal Rate  Volume:  Normal  Mood:  Anxious  Affect:  Congruent  Thought Process:  Goal Directed  Orientation:  Full (Time, Place, and Person)  Thought Content:  Focused on discharge plans, some anxiety  Suicidal Thoughts:  No   Homicidal Thoughts:  No  Memory:  Immediate;   Fair Recent;   Fair Remote;    Fair  Judgement:  Other:  improving although fair   Insight:  improving although fair  Psychomotor Activity:  Normal  Concentration:  Concentration: Good and Attention Span: Good  Recall:  Good  Fund of Knowledge:  Good  Language:  Good  Akathisia:  No  Handed:  Right  AIMS (if indicated):     Assets:  Desire for Improvement Resilience Social Support  ADL's:  Intact  Cognition:  WNL  Sleep:  Number of Hours: 6.25   Treatment Plan Summary: MDD (major depressive disorder), single episode, severe with psychotic features (HCC) unstable, yet improving, treated as below:  On 02/24/2017 , I have reviewed medications below and concur with regimen with the following changes:   Medications: -Continue Trazodone  po qhs prn rpt x1 prn insomnia -Continue Wellbutrin XL  po daily for depression -Continue Nicotine patch for smoking cessation -Continue Vistaril  po q6h prn anxiety  Other: Treatment plan reviewed as below today 4/27  Encourage group and milieu participation to work on coping skills and symptom reduction Encourage efforts to work on sobriety, abstinence Family meeting scheduled for Monday   Beau Fanny, [place] 02/24/2017, 4:09 PM

## 2017-02-24 NOTE — Plan of Care (Signed)
Problem: Education: Goal: Mental status will improve Outcome: Progressing Patient is pleasant and denies any depressive symptoms.

## 2017-02-24 NOTE — [facility] Group Notes (Addendum)
Healthy Support Systems   Date:  02/24/2017  Time:  4:23 PM  Type of Therapy:  Nurse Education  /  Healthy SUpport SYstems:  The group focuses on teaching patients how to identify their unhelathy behaviors  As well as establish and utilize Healthy Support Systems  Participation Level:  Active  Participation Quality:  Appropriate  Affect:  Anxious  Cognitive:  Appropriate  Insight:  Appropriate  Engagement in Group:  Engaged  Modes of Intervention:  Education  Summary of Progress/Problems:  Todd Aguilar 02/24/2017, 4:23 PM

## 2017-02-24 NOTE — [facility] Group Notes (Signed)
Life Skills   Date:  02/23/2017  Time:  1300  Type of Therapy:  Nurse Education  /  Identifying Needs :  The group focuses on teaching patients how to identify their needs as well as how to develop the skills needed to get them met.  Participation Level:  Active  Participation Quality:  Attentive  Affect:  Blunted  Cognitive:  Alert  Insight:  Good  Engagement in Group:  Engaged  Modes of Intervention:  Education  Summary of Progress/Problems:  Lauralyn Primes 02/24/2017, 11:52 AM

## 2017-02-24 NOTE — Progress Notes (Signed)
D: Patient is pleasant upon approach.  He hopes to be discharged soon.  He denies any depressive symptoms or hopelessness; he rates his anxiety as a 4.  He is sleeping fair; appetite is good; energy level is normal and concentration is good.  Ibuprofen was ordered for patient's back pain.  He is observed in the day room interacting with his peers.  He is attending groups and participating in his treatment.  He denies any thoughts of self harm.  He denies HI and is not responding to internal stimuli.   A: Continue to monitor medication management and MD orders.  Safety checks completed every 15 minutes per protocol.  Offer support and encouragement as needed. R: Patient is receptive to staff; his behavior is appropriate.

## 2017-02-24 NOTE — [facility] Group Notes (Signed)
[facility] LCSW Group Therapy Note     02/24/2017 at 10 AM   Type of Therapy and Topic: Group Therapy: Feelings Around Returning Home & Establishing a Supportive Framework and Activity to Identify signs of Improvement or Decompensation   Participation Level: Active    Description of Group:  Patients first processed thoughts and feelings about up coming discharge. These included fears of upcoming changes, lack of change, new living environments, judgements and expectations from others and overall stigma of MH issues. We then discussed what is a supportive framework? What does it look like feel like and how do I discern it from and unhealthy non-supportive network? Learn how to cope when supports are not helpful and don't support you. Discuss what to do when your family/friends are not supportive.   Therapeutic Goals Addressed in Processing Group:  1. Patient will identify one healthy supportive network that they can use at discharge. 2. Patient will identify one factor of a supportive framework and how to tell it from an unhealthy network. 3. Patient able to identify one coping skill to use when they do not have positive supports from others. 4. Patient will demonstrate ability to communicate their needs through discussion and/or role plays.  Summary of Patient Progress:  Pt was attentive during group session. As patients processed their anxiety about discharge and described healthy supports patient was attentive to others while working on art work & organization in his physical area.  Patient chose a visual to represent decompensation as being in a tunnel and improvement as reflection by the water. Patient seemed to be attentive to others as evidenced by his body language.   Carney Bern, LCSW

## 2017-02-24 NOTE — Progress Notes (Signed)
Patient has been up and active on the unit, attended group this evening and is looking forward to discharging on tomorrow.  He was introduced to another patient that was having a hard time interacting with others due to him feeling self conscience about his disability and Todd Aguilar was very helpful  talking with him and making him feel more at ease. Patient currently denies having pain, -si/hi/a/v hall. Support and encouragement offered, safety maintained on unit, will continue to monitor.

## 2017-02-25 NOTE — Progress Notes (Signed)
D: Pt presents with flat affect. Pt reports decreased depression and anxiety today. Pt reports difficulty sleeping last night due to racing thoughts. Pt denies suicidal thoughts and verbally contracts for safety. Pt stated that he have a family session today with his parents. Pt discharge plans are to return back to work and live at a motel unil he's able to save enough money to get his own apartment. Pt compliant with taking meds. No side effect to meds verbalized by pt. A: Medications reviewed with pt. Medications administered as ordered per MD. Verbal support provided. 15 minute checks performed for safety.  R: Pt receptive to tx.

## 2017-02-25 NOTE — [facility] Group Notes (Signed)
[facility] LCSW Aftercare Discharge Planning Group Note   02/25/2017  8:45 AM  Participation Quality:  Active  Mood/Affect:  Appropriate  Depression Rating:  Pt denies   Anxiety Rating:  Pt denies   Thoughts of Suicide:  No  Current AVH:  No  Plan for Discharge/Comments: Pt denies all symptoms today and is feeling hopeful about discharge. Pt plans to live in a hotel for a few days and then get an apartment once he gets paid on Thursday. Pt states that his parents are not willing to take him back into their home and he is okay with that because he would like to start being more independent and doing things on his own anyway.  Transportation Means: Pt has access to transporation  Supports: Family, mental health providers   Jonathon Jordan, MSW, [facility] 02/25/2017 4:56 PM

## 2017-02-25 NOTE — Progress Notes (Addendum)
  [facility] Adult Case Management Discharge Plan :  Will you be returning to the same living situation after discharge:  No. Pt will be living at a hotel and will then transition into an apartment. At discharge, do you have transportation home?: Yes,  pt's parents will provide pt transportation to his car. Do you have the ability to pay for your medications: Yes,  pt has insurance.  Release of information consent forms completed and in the chart;  Patient's signature needed at discharge.  Patient to Follow up at: Follow-up Information    [facility] Follow up on 02/27/2017.   Specialty:  Behavioral Health Why:  Assessment appointment for PHP (Partial Hospitalization Program) :30PM. Please arrive at 1:30PM to complete paperwork. Please call to cancel/reschedule if needed. Contact information: [address] [id number] mc [place] [address] [phone]       [facility] Follow up.   Why:  If you decide to cancel or reschedule your PHP appointment, please call and schedule an appointment for therapy with Dr. Ellery Plunk within 7 days of discharge. Thank you. Contact information: [address] [place], [place]  [address] Office: [phone]   Fax: [phone]             Next level of care provider has access to [facility] Link:yes  Safety Planning and Suicide Prevention discussed: Yes,  with pt and with pt's mother.  Have you used any form of tobacco in the last 30 days? (Cigarettes, Smokeless Tobacco, Cigars, and/or Pipes): Yes  Has patient been referred to the Quitline?: Patient refused referral  Patient has been referred for addiction treatment: Yes  Jonathon Jordan, MSW, LCSWA  02/25/2017, 11:24 AM

## 2017-02-25 NOTE — Plan of Care (Signed)
Problem: Medication: Goal: Compliance with prescribed medication regimen will improve Outcome: Progressing Pt compliant with taking scheduled meds.   

## 2017-02-25 NOTE — BHH Group Notes (Signed)
Adult Psychoeducational Group Note  Date:  02/25/2017 Time:  10:52 PM  Group Topic/Focus:  Wrap-Up Group:   The focus of this group is to help patients review their daily goal of treatment and discuss progress on daily workbooks.  Participation Level:  Active  Participation Quality:  Appropriate  Affect:  Appropriate  Cognitive:  Alert and Appropriate  Insight: Appropriate  Engagement in Group:  Engaged  Modes of Intervention:  Discussion  Additional Comments:  Pt expressed his readiness for discharge tomorrow.   Jamas Lav 02/25/2017, 10:52 PM

## 2017-02-25 NOTE — Progress Notes (Signed)
CSW had a family session with pt, MD, pt's mother, pt's father, and pt's brother. Pt's parents and brother all voiced their concerns about the path that the pt is heading down. Pt validated their concerns but was unable to give any reasoning as to why he has been behaving in a defiant manner. Pt did however state that he wishes to turn his life around and become more independent. Pt's plan is to discharge tomorrow and get an apartment shortly after. CSW discussed the option of PHP with the pt and pt stated that he would go to the assessment appointment to see if it would be a good fit for his needs. Pt's family seemed to be supportive of pt's desire to become more independent.   Jonathon Jordan, MSW, Theresia Majors [phone]

## 2017-02-25 NOTE — Progress Notes (Signed)
Recreation Therapy Notes  Date: 02/25/17 Time: 0930 Location: [address] Dayroom  Group Topic: Stress Management  Goal Area(s) Addresses:  Patient will verbalize importance of using healthy stress management.  Patient will identify positive emotions associated with healthy stress management.   Behavioral Response: Engaged  Intervention: Stress Management  Activity :  Progressive Muscle Relaxation.  LRT read a script to guide patients through the stress management technique of progressive muscle relaxation.  Patients were to follow along as the script was read to engage in the technique.  Education:  Stress Management, Discharge Planning.   Education Outcome: Acknowledges edcuation/In group clarification offered/Needs additional education  Clinical Observations/Feedback:  Pt attended group.   Todd Aguilar, LRT/CTRS         Ceylin Dreibelbis A 02/25/2017 12:23 PM 

## 2017-02-25 NOTE — Progress Notes (Signed)
Adult Psychoeducational Group Note  Date:  02/25/2017 Time:  11:21 AM  Group Topic/Focus:  Wellness Toolbox:   The focus of this group is to discuss various aspects of wellness, balancing those aspects and exploring ways to increase the ability to experience wellness.  Patients will create a wellness toolbox for use upon discharge.  Participation Level:  Active  Participation Quality:  Appropriate and Attentive  Affect:  Appropriate  Cognitive:  Alert and Appropriate  Insight: Appropriate, Good and Improving  Engagement in Group:  Engaged  Modes of Intervention:  Discussion  Additional Comments:  Patient participated in group this morning.   Loida Calamia R Pollyanna Levay 02/25/2017, 11:21 AM

## 2017-02-25 NOTE — Progress Notes (Signed)
Patient attended group and his question was: Describe your favorite place. He said that this favorite place to be is outside around nature.

## 2017-02-25 NOTE — Progress Notes (Signed)
[facility] MD Progress Note  02/25/2017 5:35 PM Todd Aguilar  MRN:  [id number]  Subjective: patient reports improvement compared to admission. Denies medication side effects.  Objective:  I have discussed case with treatment team and have met with patient. At patient's /family's request, we had a family meeting today with patient's father, mother, one of his older brothers, and with CSW and Probation officer . Patient's parents reported a long history of defying household rules. State they feel that cannabis abuse has played a significant role, but that there is also some underlying unresolved issues for patient, possibly stemming from adverse early childhood experiences ( was adopted at 15 from an orphanage in [place]) , or from history of being bullied in school. They reported patient spent 9 months in a christian based program in the past, and did well for a period of time after he returned. Both parents noted a tendency towards low motivation , and a tendency towards defiance of rules . Family also endorsed patient seems much improved compared to his admission , and that when he is sober he " does a lot better".  Patient remained calm, appropriate, and generally agreed with his parents and brother regarding above history. Stated he was feeling better, and needed to demonstrate to them and to himself that he could succeed. Expressed motivation in returning to work and being able to live independently . ( Mother also reported patient was in the process of getting neuro-psychological/personality assessment via his therapist, and that she had brought a booklet from patient's psychologist to continue the self assessment portion while inpatient. As reviewed with nursing staff, booklet was not given to patient due to having removable metal parts such as metal spiral.)  Patient visible in unit, going to groups, and interacting appropriately with peers. Denies medication side effects. ( On Wellbutrin for depression and history  of ADHD symptoms)  Patient denies suicidal ideations . Future oriented, wanting to return to work, move into an apartment or boarding house independently      Principal Problem: MDD (major depressive disorder), single episode, severe with psychotic features ([place])  Diagnosis:   Patient Active Problem List   Diagnosis Date Noted  . MDD (major depressive disorder), single episode, severe with psychotic features ([place]) [F32.3] 02/19/2017   Total Time spent with patient: 45 minutes- more than 50 % of time spent on counseling and disposition planning  Past Medical History:  Past Medical History:  Diagnosis Date  . Anxiety   . Depression   . Muscular disease     Past Surgical History:  Procedure Laterality Date  . CLOSED REDUCTION FINGER WITH PERCUTANEOUS PINNING Left 11/25/2016   Procedure: CLOSED REDUCTION WITH PERCUTANEOUS PINNING - INDEX AND LONG FINGERS;  Surgeon: Iran Planas, MD;  Location: [facility];  Service: Orthopedics;  Laterality: Left;  LEFT INDEX AND LONG FINGERS  . TONSILLECTOMY     Family History: History reviewed. No pertinent family history.  Social History:  History  Alcohol Use  . 0.6 - 1.2 oz/week  . 1 - 2 Cans of beer per week     History  Drug Use  . Frequency: 2.0 times per week  . Types: Marijuana    Social History   Social History  . Marital status: Single    Spouse name: N/A  . Number of children: N/A  . Years of education: N/A   Social History Main Topics  . Smoking status: Heavy Tobacco Smoker    Packs/day: 0.50    Types: Cigarettes  .  Smokeless tobacco: Former Systems developer    Types: Chew  . Alcohol use 0.6 - 1.2 oz/week    1 - 2 Cans of beer per week  . Drug use: Yes    Frequency: 2.0 times per week    Types: Marijuana  . Sexual activity: Not Asked   Other Topics Concern  . None   Social History Narrative  . None   Additional Social History:    Pain Medications: denies Prescriptions: denies Over the Counter: denies History of alcohol /  drug use?: Yes Name of Substance 1: alcohol 1 - Age of First Use: [age] 1 - Amount (size/oz): varies 1 - Frequency: varies 1 - Duration: ongoing 1 - Last Use / Amount: last night/a 24 pack of beer and a 1/2 bottle of whiskey Name of Substance 2: marijuana 2 - Age of First Use: 18 2 - Amount (size/oz): varies 2 - Frequency: 3xs/week 2 - Duration: ongoing 2 - Last Use / Amount: last night  Sleep: Good  Appetite:  Good  Current Medications: Current Facility-Administered Medications  Medication Dose Route Frequency Provider Last Rate Last Dose  . acetaminophen (TYLENOL) tablet 500 mg  500 mg Oral Q6H PRN Laverle Hobby, PA-C   500 mg at 02/23/17 2131  . alum & mag hydroxide-simeth (MAALOX/MYLANTA) 200-200-20 MG/5ML suspension 30 mL  30 mL Oral Q4H PRN Laverle Hobby, PA-C      . buPROPion (WELLBUTRIN XL) 24 hr tablet 150 mg  150 mg Oral Daily Jenne Campus, MD   150 mg at 02/25/17 0814  . feeding supplement (ENSURE ENLIVE) (ENSURE ENLIVE) liquid 237 mL  237 mL Oral BID PRN Jenne Campus, MD      . hydrOXYzine (ATARAX/VISTARIL) tablet 25 mg  25 mg Oral Q6H PRN Laverle Hobby, PA-C   25 mg at 02/23/17 5638  . ibuprofen (ADVIL,MOTRIN) tablet 600 mg  600 mg Oral Q6H PRN Derrill Center, NP   600 mg at 02/24/17 0950  . magnesium hydroxide (MILK OF MAGNESIA) suspension 30 mL  30 mL Oral Daily PRN Laverle Hobby, PA-C      . nicotine (NICODERM CQ - dosed in mg/24 hours) patch 14 mg  14 mg Transdermal Daily Jenne Campus, MD   14 mg at 02/24/17 0815  . traZODone (DESYREL) tablet 50 mg  50 mg Oral QHS,MR X 1 Derrill Center, NP   50 mg at 02/24/17 2304    Lab Results:  No results found for this or any previous visit (from the past 48 hour(s)).  Blood Alcohol level:  Lab Results  Component Value Date   ETH <5 75/64/3329    Metabolic Disorder Labs: Lab Results  Component Value Date   HGBA1C 4.9 02/20/2017   MPG 94 02/20/2017   Lab Results  Component Value Date   PROLACTIN  35.4 (H) 02/20/2017   Lab Results  Component Value Date   CHOL 161 02/20/2017   TRIG 136 02/20/2017   HDL 53 02/20/2017   CHOLHDL 3.0 02/20/2017   VLDL 27 02/20/2017   LDLCALC 81 02/20/2017    Physical Findings: AIMS:  , ,  ,  ,    CIWA:    COWS:     Musculoskeletal: Strength & Muscle Tone: within normal limits and flaccid Gait & Station: shuffle-affected by lower extremity hypertonia  Patient leans: N/A  Psychiatric Specialty Exam: Physical Exam  Nursing note and vitals reviewed. Constitutional: He is oriented to person, place, and time.  Neurological:  He is alert and oriented to person, place, and time.    Review of Systems  Psychiatric/Behavioral: Positive for depression and substance abuse. Negative for hallucinations and suicidal ideas. The patient is not nervous/anxious.   All other systems reviewed and are negative. no nausea, no vomiting   Blood pressure 133/72, pulse 62, temperature 98.8 F (37.1 C), resp. rate 16, height 5' 5"  (1.651 m), weight 66.7 kg (147 lb), SpO2 100 %.Body mass index is 24.46 kg/m.  General Appearance: Well Groomed   Eye Contact:  Good  Speech:  Normal Rate  Volume:  Normal  Mood:  improving mood, anxious at times   Affect:  fully reactive  Thought Process:  Linear and Descriptions of Associations: Intact  Orientation:  Full (Time, Place, and Person)  Thought Content:  Focused on discharge plans, some anxiety  Suicidal Thoughts:  No  Patient denies any current suicidal plan or intention, and is future oriented, denies any homicidal or violent ideations  Homicidal Thoughts:  No  Memory:   Recent and remote grossly intact   Judgement:  Improving    Insight:  Improving   Psychomotor Activity:  Normal  Concentration:  Concentration: Good and Attention Span: Good  Recall:  Good  Fund of Knowledge:  Good  Language:  Good  Akathisia:  No  Handed:  Right  AIMS (if indicated):     Assets:  Communication Skills Desire for  Improvement Resilience  ADL's:  Intact  Cognition:  WNL  Sleep:  Number of Hours: 6    Assessment - patient improving compared to admission . At this time mood is improved, affect is reactive . Parents report history of a pattern of breaking home rules and defiance with their rules, authority, and stress he has a significant history of cannabis abuse, which they feel has contributed to his difficulties as well . Of note, patient does not endorse history of or present with behaviors indicative of antisocial personality disorder, and on unit has not exhibited any disruptive behaviors. He agrees he has been defiant and challenging to parents and cannot explain why, but does feel that cannabis has played a negative role. He also reports symptoms of ADHD which have made academic performance more difficult for him. At this time doing well on Wellbutrin , denies side effects Treatment Plan Summary:   Treatment plan reviewed as below today 4/30   Medications: -Continue Trazodone 15m po qhs prn for insomnia  -Continue Wellbutrin XL 1570mpo daily for depression and ADHD symptoms, also may help for smoking cessation efforts ( of note, patient denies any history of seizures)  -Continue Nicotine patch for smoking cessation -Continue Vistaril 2576mo q6h prn anxiety - At this time focusing on disposition planning , patient interest in independent living and returning to work. Options discussed, suggested have included PHP, IOP, and possibly going to an [employer] - Importance of abstinence, relapse prevention efforts has been stressed   FerJenne CampusD 02/25/2017, 5:35 PM   Patient ID: PetTheda Belfastale   DOB: [birth year] 83o.   MRN: [id number]

## 2017-02-25 NOTE — Tx Team (Signed)
Interdisciplinary Treatment and Diagnostic Plan Update  02/25/2017 Time of Session: 9:30am Todd Aguilar MRN: [id number]  Principal Diagnosis: MDD, Cannabis Dependence   Secondary Diagnoses: Principal Problem:   MDD (major depressive disorder), single episode, severe with psychotic features (HCC)   Current Medications:  Current Facility-Administered Medications  Medication Dose Route Frequency Provider Last Rate Last Dose  . acetaminophen (TYLENOL) tablet 500 mg  500 mg Oral Q6H PRN Kerry Hough, PA-C   500 mg at 02/23/17 2131  . alum & mag hydroxide-simeth (MAALOX/MYLANTA) 200-200-20 MG/5ML suspension 30 mL  30 mL Oral Q4H PRN Kerry Hough, PA-C      . buPROPion (WELLBUTRIN XL) 24 hr tablet 150 mg  150 mg Oral Daily Craige Cotta, MD   150 mg at 02/25/17 0814  . feeding supplement (ENSURE ENLIVE) (ENSURE ENLIVE) liquid 237 mL  237 mL Oral BID PRN Craige Cotta, MD      . hydrOXYzine (ATARAX/VISTARIL) tablet 25 mg  25 mg Oral Q6H PRN Kerry Hough, PA-C   25 mg at 02/23/17 1324  . ibuprofen (ADVIL,MOTRIN) tablet 600 mg  600 mg Oral Q6H PRN Oneta Rack, NP   600 mg at 02/24/17 0950  . magnesium hydroxide (MILK OF MAGNESIA) suspension 30 mL  30 mL Oral Daily PRN Kerry Hough, PA-C      . nicotine (NICODERM CQ - dosed in mg/24 hours) patch 14 mg  14 mg Transdermal Daily Craige Cotta, MD   14 mg at 02/24/17 0815  . traZODone (DESYREL) tablet 50 mg  50 mg Oral QHS,MR X 1 Oneta Rack, NP   50 mg at 02/24/17 2304    PTA Medications: Prescriptions Prior to Admission  Medication Sig Dispense Refill Last Dose  . acetaminophen (TYLENOL) 500 MG tablet Take 500-2,000 mg by mouth every 6 (six) hours as needed for moderate pain.   02/18/2017 at Unknown time  . cephALEXin (KEFLEX) 500 MG capsule Take 1 capsule (500 mg total) by mouth 4 (four) times daily. (Patient not taking: Reported on 02/19/2017) 40 capsule 0 Completed Course at Unknown time  . docusate sodium (COLACE) 100 MG  capsule Take 1 capsule (100 mg total) by mouth 2 (two) times daily. (Patient not taking: Reported on 02/19/2017) 10 capsule 0 Completed Course at Unknown time  . magnesium 30 MG tablet Take 30 mg by mouth daily.   02/18/2017 at Unknown time    Treatment Modalities: Medication Management, Group therapy, Case management,  1 to 1 session with clinician, Psychoeducation, Recreational therapy.  Patient Stressors: Financial difficulties Loss of housing Marital or family conflict Substance abuse Other: Acute Psychosis, Depressive Symptoms  Patient Strengths: Average or above average intelligence Wellsite geologist fund of knowledge Motivation for treatment/growth Work Firefighter for Primary Diagnosis: MDD, Cannabis Dependence  Long Term Goal(s): Improvement in symptoms so as ready for discharge  Short Term Goals: Ability to verbalize feelings will improve Ability to disclose and discuss suicidal ideas Ability to demonstrate self-control will improve Ability to identify and develop effective coping behaviors will improve Ability to maintain clinical measurements within normal limits will improve Ability to identify triggers associated with substance abuse/mental health issues will improve  Medication Management: Evaluate patient's response, side effects, and tolerance of medication regimen.  Therapeutic Interventions: 1 to 1 sessions, Unit Group sessions and Medication administration.  Evaluation of Outcomes: Adequate for Discharge  Physician Treatment Plan for Secondary Diagnosis: Principal Problem:   MDD (major depressive disorder), single  episode, severe with psychotic features (HCC)   Long Term Goal(s): Improvement in symptoms so as ready for discharge  Short Term Goals: Ability to verbalize feelings will improve Ability to disclose and discuss suicidal ideas Ability to demonstrate self-control will improve Ability to identify and develop  effective coping behaviors will improve Ability to maintain clinical measurements within normal limits will improve Ability to identify triggers associated with substance abuse/mental health issues will improve  Medication Management: Evaluate patient's response, side effects, and tolerance of medication regimen.  Therapeutic Interventions: 1 to 1 sessions, Unit Group sessions and Medication administration.  Evaluation of Outcomes: Adequate for Discharge   RN Treatment Plan for Primary Diagnosis: MDD, Cannabis Dependence  Long Term Goal(s): Knowledge of disease and therapeutic regimen to maintain health will improve  Short Term Goals: Ability to verbalize feelings will improve, Ability to disclose and discuss suicidal ideas and Ability to identify and develop effective coping behaviors will improve  Medication Management: RN will administer medications as ordered by provider, will assess and evaluate patient's response and provide education to patient for prescribed medication. RN will report any adverse and/or side effects to prescribing provider.  Therapeutic Interventions: 1 on 1 counseling sessions, Psychoeducation, Medication administration, Evaluate responses to treatment, Monitor vital signs and CBGs as ordered, Perform/monitor CIWA, COWS, AIMS and Fall Risk screenings as ordered, Perform wound care treatments as ordered.  Evaluation of Outcomes: Adequate for Discharge   LCSW Treatment Plan for Primary Diagnosis: MDD, Cannabis Dependence  Long Term Goal(s): Safe transition to appropriate next level of care at discharge, Engage patient in therapeutic group addressing interpersonal concerns.  Short Term Goals: Engage patient in aftercare planning with referrals and resources, Identify triggers associated with mental health/substance abuse issues and Increase skills for wellness and recovery  Therapeutic Interventions: Assess for all discharge needs, 1 to 1 time with Social worker,  Explore available resources and support systems, Assess for adequacy in community support network, Educate family and significant other(s) on suicide prevention, Complete Psychosocial Assessment, Interpersonal group therapy.  Evaluation of Outcomes: Adequate for Discharge   Progress in Treatment: Attending groups: Yes Participating in groups: Yes Taking medication as prescribed: Yes, MD continues to assess for medication changes as needed Toleration medication: Yes, no side effects reported at this time Family/Significant other contact made: Yes with mother Patient understands diagnosis: Yes, AEB pt's willingness to participcate in treatment. Discussing patient identified problems/goals with staff: Yes Medical problems stabilized or resolved: Yes Denies suicidal/homicidal ideation: Yes Issues/concerns per patient self-inventory: None Other: N/A  New problem(s) identified: None identified at this time.   New Short Term/Long Term Goal(s): None identified at this time.   Discharge Plan or Barriers: Pt will be staying in a hotel for a few days and then getting an apartment once he gets paid. Pt will follow up on an outpatient basis with PHP or Dr. Ellery Plunk at [facility].  Reason for Continuation of Hospitalization:  None identified at this time.  Estimated Length of Stay: 0 days  Attendees: Patient: 02/25/2017  11:20 AM  Physician: Dr. Jama Flavors 02/25/2017  11:20 AM  Nursing: Foy Guadalajara RN; Rodell Perna, RN 02/25/2017  11:20 AM  RN Care Manager: Onnie Boer, RN 02/25/2017  11:20 AM  Social Worker: Donnelly Stager, LCSWA 02/25/2017  11:20 AM  Recreational Therapist:  02/25/2017  11:20 AM  Other: Armandina Stammer, NP; Gray Bernhardt, NP 02/25/2017  11:20 AM  Other:  02/25/2017  11:20 AM  Other: 02/25/2017  11:20 AM    Scribe for Treatment  Team: Jonathon Jordan, MSW, [facility] 02/25/2017 11:20 AM

## 2017-02-26 MED ORDER — ONDANSETRON 4 MG PO TBDP
4.0000 mg | ORAL_TABLET | Freq: Once | ORAL | Status: AC
  Administered 2017-02-26: 4 mg via ORAL

## 2017-02-26 MED ORDER — HYDROXYZINE HCL 25 MG PO TABS: 25.0000 mg | ORAL_TABLET | Freq: Four times a day (QID) | ORAL | 0 refills | Status: DC | PRN

## 2017-02-26 MED ORDER — BUPROPION HCL ER (XL) 150 MG PO TB24: 150.0000 mg | ORAL_TABLET | Freq: Every day | ORAL | 0 refills | Status: DC

## 2017-02-26 MED ORDER — NICOTINE 14 MG/24HR TD PT24: 14.0000 mg | MEDICATED_PATCH | Freq: Every day | TRANSDERMAL | 0 refills | Status: DC

## 2017-02-26 MED ORDER — ONDANSETRON 4 MG PO TBDP
ORAL_TABLET | ORAL | Status: AC
  Filled 2017-02-26: qty 1

## 2017-02-26 MED ORDER — TRAZODONE HCL 50 MG PO TABS: 50.0000 mg | ORAL_TABLET | Freq: Every evening | ORAL | 0 refills | Status: DC | PRN

## 2017-02-26 NOTE — Progress Notes (Signed)
CSW called pt's therapist, Dr. Karie Schwalbe Hedding at [phone]. No answer, CSW left a message.  Jonathon Jordan, MSW, Theresia Majors [phone]

## 2017-02-26 NOTE — Progress Notes (Signed)
Discharge note:  Patient discharged home per MD order.  Patient received all personal belongings from locker and unit.  Reviewed AVS/transition record with patient and he indicated understanding.  Patient denies any thoughts of self harm.  Patient left with prescriptions of his medications.  Patient also has a note for his employer to return to work.  Patient left ambulatory and had his own transportation here.

## 2017-02-26 NOTE — Progress Notes (Signed)
Todd Aguilar had been up and visible in milieu this evening, did attend and participate in evening group activity. He has appeared withdrawn this evening and has not been as jovial as he had this past weekend. He simply stated that he was doing ok and spoke about being ready for discharge in the morning and did receive all bedtime medications without incident. A. Support and encouragement provided. R. Safety maintained, will continue to monitor.

## 2017-02-26 NOTE — Discharge Summary (Signed)
Physician Discharge Summary Note  Patient:  Todd Aguilar is an 21 y.o., male MRN:  [id number] DOB:  [birth year] Patient phone:  [phone] (home)  Patient address:   [address] [place] [address],  Total Time spent with patient: 30 minutes  Date of Admission:  02/19/2017 Date of Discharge: 02/26/2017  Reason for Admission:  Worsening depression  Principal Problem: MDD (major depressive disorder), single episode, severe with psychotic features [facility]) Discharge Diagnoses: Patient Active Problem List   Diagnosis Date Noted  . MDD (major depressive disorder), single episode, severe with psychotic features (HCC) [F32.3] 02/19/2017    Past Psychiatric History: see HPI  Past Medical History:  Past Medical History:  Diagnosis Date  . Anxiety   . Depression   . Muscular disease     Past Surgical History:  Procedure Laterality Date  . CLOSED REDUCTION FINGER WITH PERCUTANEOUS PINNING Left 11/25/2016   Procedure: CLOSED REDUCTION WITH PERCUTANEOUS PINNING - INDEX AND LONG FINGERS;  Surgeon: Bradly Bienenstock, MD;  Location: [facility] OR;  Service: Orthopedics;  Laterality: Left;  LEFT INDEX AND LONG FINGERS  . TONSILLECTOMY     Family History: History reviewed. No pertinent family history. Family Psychiatric  History: see HPI Social History:  History  Alcohol Use  . 0.6 - 1.2 oz/week  . 1 - 2 Cans of beer per week     History  Drug Use  . Frequency: 2.0 times per week  . Types: Marijuana    Social History   Social History  . Marital status: Single    Spouse name: N/A  . Number of children: N/A  . Years of education: N/A   Social History Main Topics  . Smoking status: Heavy Tobacco Smoker    Packs/day: 0.50    Types: Cigarettes  . Smokeless tobacco: Former Neurosurgeon    Types: Chew  . Alcohol use 0.6 - 1.2 oz/week    1 - 2 Cans of beer per week  . Drug use: Yes    Frequency: 2.0 times per week    Types: Marijuana  . Sexual activity: Not Asked   Other Topics Concern  .  None   Social History Narrative  . None    Hospital Course:  Todd Aguilar is a 21 y.o. male who presented voluntarily to [facility] as a walk in. Pt reported being unmotivated in life and just wanting "to end it". Pt reports feeling this way for several years and attributes this mood to being bullied throughout his 4 years of high school. Pt admitted to trying to end his life several times, the last time being last night when he drank a 24 pack of beer, smoked weed, and drank a 1/2 bottle of whiskey. Pt reported previous attempts by trying to drink bleach and OD on pain pills. Pt presented as depressed, ashamed, and anxious.      Todd Aguilar was admitted for MDD (major depressive disorder), single episode, severe with psychotic features (HCC) and crisis management.  Patient was treated with medications with their indications listed below in detail under Medication List.  Medical problems were identified and treated as needed.  Home medications were restarted as appropriate.  Improvement was monitored by observation and Todd Aguilar daily report of symptom reduction.  Emotional and mental status was monitored by daily self inventory reports completed by Todd Aguilar and clinical staff.  Patient reported continued improvement, denied any new concerns.  Patient had been compliant on medications and denied side effects.  Support and  encouragement was provided.         Todd Aguilar was evaluated by the treatment team for stability and plans for continued recovery upon discharge.  Patient was offered further treatment options upon discharge including Residential, Intensive Outpatient and Outpatient treatment. Patient will follow up with agency listed below for medication management and counseling.  Encouraged patient to maintain satisfactory support network and home environment.  Advised to adhere to medication compliance and outpatient treatment follow up.  Prescriptions provided.       Azion Centrella motivation  was an integral factor for scheduling further treatment.  Employment, transportation, bed availability, health status, family support, and any pending legal issues were also considered during patient's hospital stay.  Upon completion of this admission the patient was both mentally and medically stable for discharge denying suicidal/homicidal ideation, auditory/visual/tactile hallucinations, delusional thoughts and paranoia.      Physical Findings: AIMS:  , ,  ,  ,    CIWA:    COWS:     Musculoskeletal: Strength & Muscle Tone: within normal limits Gait & Station: normal Patient leans: N/A  Psychiatric Specialty Exam:  See MD SRA Physical Exam  Nursing note and vitals reviewed.   ROS  Blood pressure 128/62, pulse 67, temperature 97.9 F (36.6 C), temperature source Oral, resp. rate 16, height  (1.651 m), weight 66.7 kg (147 lb), SpO2 100 %.Body mass index is 24.46 kg/m.    Have you used any form of tobacco in the last 30 days? (Cigarettes, Smokeless Tobacco, Cigars, and/or Pipes): Yes  Has this patient used any form of tobacco in the last 30 days? (Cigarettes, Smokeless Tobacco, Cigars, and/or Pipes) Yes, N/A  Blood Alcohol level:  Lab Results  Component Value Date   ETH <5 02/19/2017    Metabolic Disorder Labs:  Lab Results  Component Value Date   HGBA1C 4.9 02/20/2017   MPG 94 02/20/2017   Lab Results  Component Value Date   PROLACTIN 35.4 (H) 02/20/2017   Lab Results  Component Value Date   CHOL 161 02/20/2017   TRIG 136 02/20/2017   HDL 53 02/20/2017   CHOLHDL 3.0 02/20/2017   VLDL 27 02/20/2017   LDLCALC 81 02/20/2017    See Psychiatric Specialty Exam and Suicide Risk Assessment completed by Attending Physician prior to discharge.  Discharge destination:  Home  Is patient on multiple antipsychotic therapies at discharge:  No   Has Patient had three or more failed trials of antipsychotic monotherapy by history:  No  Recommended Plan for Multiple  Antipsychotic Therapies: NA   Allergies as of 02/26/2017   No Known Allergies     Medication List    STOP taking these medications   acetaminophen 500 MG tablet Commonly known as:  TYLENOL   cephALEXin 500 MG capsule Commonly known as:  KEFLEX   docusate sodium 100 MG capsule Commonly known as:  COLACE   magnesium 30 MG tablet     TAKE these medications     Indication  buPROPion 150 MG 24 hr tablet Commonly known as:  WELLBUTRIN XL Take 1 tablet (150 mg total) by mouth daily. Start taking on:  02/27/2017  Indication:  mood stabilization   hydrOXYzine 25 MG tablet Commonly known as:  ATARAX/VISTARIL Take 1 tablet (25 mg total) by mouth every 6 (six) hours as needed for anxiety.  Indication:  Anxiety Neurosis   nicotine 14 mg/24hr patch Commonly known as:  NICODERM CQ - dosed in mg/24 hours Place 1 patch (14 mg  total) onto the skin daily. Start taking on:  02/27/2017  Indication:  Nicotine Addiction   traZODone 50 MG tablet Commonly known as:  DESYREL Take 1 tablet (50 mg total) by mouth at bedtime and may repeat dose one time if needed.  Indication:  Trouble Sleeping      Follow-up Information    BEHAVIORAL HEALTH INTENSIVE PSYCH Follow up on 02/27/2017.   Specialty:  Behavioral Health Why:  Assessment appointment for PHP (Partial Hospitalization Program) :30PM. Please arrive at 1:30PM to complete paperwork. Please call to cancel/reschedule if needed. Contact information: [address] [id number] mc [place] [address] [phone]       [facility] Follow up.   Why:  If you decide to cancel or reschedule your PHP appointment, please call and schedule an appointment for therapy with Dr. Ellery Plunk within 7 days of discharge. Thank you. Contact information: [address] [place], [place]  [address] Office: [phone]   Fax: [phone]            Follow-up recommendations:  Activity:  as  tol Diet:  as tol  Comments:  1.  Take all your medications as prescribed.   2.  Report any adverse side effects to outpatient provider. 3.  Patient instructed to not use alcohol or illegal drugs while on prescription medicines. 4.  In the event of worsening symptoms, instructed patient to call 911, the crisis hotline or go to nearest emergency room for evaluation of symptoms.  Signed: Lindwood Qua, NP [facility] 02/26/2017, 11:48 AM

## 2017-02-26 NOTE — Progress Notes (Signed)
CSW has attempted to contact pt's therapist, Dr. Karie Schwalbe Hedding, 3 times during the duration of pt's hospital stay. Each time CSW has left a message on Dr. Launa Grill voicemail with contact information for this Clinical research associate. Most recent attempt was today 02/26/17 :02AM. Dr. Ellery Plunk left a message on CSW's voicemail and CSW returned call to receive Dr. Launa Grill voicemail. CSW left a message. CSW will continue to try to contact Dr. Ellery Plunk as pt is expected to discharge today.  Jonathon Jordan, MSW, Theresia Majors [phone]

## 2017-02-26 NOTE — [facility] Group Notes (Signed)
[facility] Group Notes:  (Nursing/MHT/Case Management/Adjunct)  Date:  02/26/2017  Time:  0900 am  Type of Therapy:  Psychoeducational Skills  Participation Level:  Active  Participation Quality:  Appropriate  Affect:  Appropriate  Cognitive:  Alert  Insight:  Good  Engagement in Group:  Engaged  Modes of Intervention:  Support  Summary of Progress/Problems: Patient discussed his discharge.  He plans to go to a hotel until he can find an apartment.    Cranford Mon 02/26/2017, 11:20 AM

## 2017-02-26 NOTE — [facility] Suicide Risk Assessment (Signed)
[facility] Discharge Suicide Risk Assessment   Principal Problem: MDD (major depressive disorder), single episode, severe with psychotic features [facility]) Discharge Diagnoses:  Patient Active Problem List   Diagnosis Date Noted  . MDD (major depressive disorder), single episode, severe with psychotic features (HCC) [F32.3] 02/19/2017    Total Time spent with patient: 30 minutes  Musculoskeletal: Strength & Muscle Tone:  History of lower extremity spasticity  Gait & Station: affected by above Patient leans: N/A  Psychiatric Specialty Exam: ROS denies nausea or vomiting, no fever, no chills   Blood pressure 128/62, pulse 67, temperature 97.9 F (36.6 C), temperature source Oral, resp. rate 16, height  (1.651 m), weight 66.7 kg (147 lb), SpO2 100 %.Body mass index is 24.46 kg/m.  General Appearance: Well Groomed  Eye Contact::  Good  Speech:  Normal Rate409  Volume:  Normal  Mood:  reports feeling better,denies significant depression at this time  Affect:  appropriate, reactive , smiles at times appropriately   Thought Process:  Linear and Descriptions of Associations: Intact  Orientation:  Full (Time, Place, and Person)  Thought Content:  no hallucinations, no delusions, not internally preoccupied   Suicidal Thoughts:  No denies suicidal or self injurious ideations, denies any homicidal or violent ideations  Homicidal Thoughts:  No  Memory:  recent and remote grossly intact   Judgement:  Other:  fair- improving   Insight:  improving   Psychomotor Activity:  Normal  Concentration:  Good  Recall:  Good  Fund of Knowledge:Good  Language: Good  Akathisia:  Negative  Handed:  Right  AIMS (if indicated):     Assets:  Communication Skills Desire for Improvement Resilience  Sleep:  Number of Hours: 6  Cognition: WNL  ADL's:  Intact   Mental Status Per Nursing Assessment::   On Admission:     Demographic Factors:  21 year old male, single, was living with parents, but now plans  to live independently, employed   Loss Factors: Recently kicked out of the parental home. Family stressors   Historical Factors: History of Cannabis Abuse   Risk Reduction Factors:   Sense of responsibility to family, Employed and Positive coping skills or problem solving skills  Continued Clinical Symptoms:  At this time patient presents alert, attentive, calm, mood reported as improved compared to admission, and affect is reactive, no thought disorder, denies any suicidal or self injurious ideations, denies any homicidal or violent ideations, no hallucinations, no delusions , not internally preoccupied . He is future oriented, states he plans to return to work at [employer] this afternoon. States he has some money in the bank to allow him to rent a hotel room x 2-3 days until he is paid later this week, at which time he plans to find a place, such as a room at a boarding house . He describes increasing insight regarding cannabis use disorder, and states he plans to maintain full abstinence. He denies medication side effects. Side effects discussed  Behavior on unit in good control.  * staff( CSW) and Clinical research associate  have left messages for Dr. Ellery Plunk , patient's outpatient therapist .  Patient plans to follow up with Dr. Ellery Plunk for therapy and also plans to continue seeing his psychologist ( Dr. Gilles Chiquito- unsure of spelling) in order to continue neuro-psychological testing which had been in process prior to admission .  Cognitive Features That Contribute To Risk:  No gross cognitive deficits noted upon discharge. Is alert , attentive, and oriented x  3   Suicide Risk:  Mild:  Suicidal ideation of limited frequency, intensity, duration, and specificity.  There are no identifiable plans, no associated intent, mild dysphoria and related symptoms, good self-control (both objective and subjective assessment), few other risk factors, and identifiable protective factors, including available and  accessible social support.  Follow-up Information    BEHAVIORAL HEALTH INTENSIVE PSYCH Follow up on 02/27/2017.   Specialty:  Behavioral Health Why:  Assessment appointment for PHP (Partial Hospitalization Program) :30PM. Please arrive at 1:30PM to complete paperwork. Please call to cancel/reschedule if needed. Contact information: [address] [id number] mc [place] [address] [phone]       [facility] Follow up.   Why:  If you decide to cancel or reschedule your PHP appointment, please call and schedule an appointment for therapy with Dr. Ellery Plunk within 7 days of discharge. Thank you. Contact information: [address] [place], [place]  [address] Office: [phone]   Fax: [phone]             Plan Of Care/Follow-up recommendations:  Activity:  as tolerated  Diet:  Regular Tests:  NA Other:  See below Patient is requesting discharge and there are no current grounds for involuntary commitment . He plans to follow up as above - he is concerned PHP schedule may conflict with work schedule, but plans to discuss on intake to reconcile work/ outpatient treatment schedules . States his parents are going to bring his car later on today so he can go to work this afternoon. We reviewed the importance of cannabis and other illegal drug abstinence as integral part of treatment plan, and 12 step program participation encouraged .   Craige Cotta, MD 02/26/2017, 10:51 AM

## 2017-02-27 ENCOUNTER — Ambulatory Visit (HOSPITAL_COMMUNITY): Payer: Self-pay | Admitting: Professional

## 2017-02-28 NOTE — Social Work (Signed)
Call from patient's therapist Dr Ellery PlunkHedding, states patient had missed last appointment and declined to reschedule.  Therapist aware that patient had appt for assessment for Partial Hospitalization Program.  Therapist also states that he will contact patient and try to reschedule therapy - will return call and advise undersigned of progress on scheduling.  CSW also spoke w patient on day of discharge by phone - patient stated that he intended to present for PHP assessment and also contact therapist to reschedule OPT.  Santa GeneraAnne Miche Loughridge, LCSW Lead Clinical Social Worker Phone:  [phone]

## 2017-06-01 ENCOUNTER — Emergency Department (HOSPITAL_COMMUNITY)
Admission: EM | Admit: 2017-06-01 | Discharge: 2017-06-02 | Disposition: A | Discharge status: Other Acute Inpatient Hospital | Payer: BLUE CROSS/BLUE SHIELD | Attending: Emergency Medicine | Admitting: Emergency Medicine

## 2017-06-01 ENCOUNTER — Emergency Department (HOSPITAL_COMMUNITY): Payer: BLUE CROSS/BLUE SHIELD

## 2017-06-01 ENCOUNTER — Encounter (HOSPITAL_COMMUNITY): Payer: Self-pay | Admitting: *Deleted

## 2017-06-01 DIAGNOSIS — R45851 Suicidal ideations: Secondary | ICD-10-CM | POA: Diagnosis not present

## 2017-06-01 DIAGNOSIS — F17210 Nicotine dependence, cigarettes, uncomplicated: Secondary | ICD-10-CM | POA: Insufficient documentation

## 2017-06-01 DIAGNOSIS — Z046 Encounter for general psychiatric examination, requested by authority: Secondary | ICD-10-CM | POA: Diagnosis not present

## 2017-06-01 DIAGNOSIS — R4182 Altered mental status, unspecified: Secondary | ICD-10-CM | POA: Insufficient documentation

## 2017-06-01 DIAGNOSIS — R569 Unspecified convulsions: Secondary | ICD-10-CM

## 2017-06-01 LAB — BASIC METABOLIC PANEL
Anion gap: 8 (ref 5–15)
BUN: 15 mg/dL (ref 6–20)
CHLORIDE: 105 mmol/L (ref 101–111)
CO2: 26 mmol/L (ref 22–32)
Calcium: 9.7 mg/dL (ref 8.9–10.3)
Creatinine, Ser: 1.03 mg/dL (ref 0.61–1.24)
GFR calc non Af Amer: >60 mL/min (ref >60)
Glucose, Bld: 102 mg/dL — ABNORMAL HIGH (ref 65–99)
POTASSIUM: 3.6 mmol/L (ref 3.5–5.1)
Sodium: 139 mmol/L (ref 135–145)

## 2017-06-01 LAB — CBC WITH DIFFERENTIAL/PLATELET
Basophils Absolute: 0.1 10*3/uL (ref 0.0–0.1)
Basophils Relative: 1 %
EOS ABS: 0.3 10*3/uL (ref 0.0–0.7)
Eosinophils Relative: 4 %
HEMATOCRIT: 40.5 % (ref 39.0–52.0)
HEMOGLOBIN: 14.3 g/dL (ref 13.0–17.0)
LYMPHS ABS: 2.4 10*3/uL (ref 0.7–4.0)
Lymphocytes Relative: 36 %
MCH: 30.6 pg (ref 26.0–34.0)
MCHC: 35.3 g/dL (ref 30.0–36.0)
MCV: 86.5 fL (ref 78.0–100.0)
MONO ABS: 0.5 10*3/uL (ref 0.1–1.0)
MONOS PCT: 7 %
NEUTROS PCT: 52 %
Neutro Abs: 3.6 10*3/uL (ref 1.7–7.7)
Platelets: 203 10*3/uL (ref 150–400)
RBC: 4.68 MIL/uL (ref 4.22–5.81)
RDW: 12.8 % (ref 11.5–15.5)
WBC: 6.8 10*3/uL (ref 4.0–10.5)

## 2017-06-01 LAB — ETHANOL: Alcohol, Ethyl (B): <5 mg/dL

## 2017-06-01 LAB — MAGNESIUM: Magnesium: 2 mg/dL (ref 1.7–2.4)

## 2017-06-01 LAB — CBG MONITORING, ED: Glucose-Capillary: 99 mg/dL (ref 65–99)

## 2017-06-01 NOTE — ED Triage Notes (Signed)
Pt brought in by [facility] for c/o unconsciousness and seizure like activity; pt doesn't know what happened; pt states he drank alcohol yesterday; pt ws given ativan 2mg  IV en route for seizure like activity; pt's cbg was 61 upon arrival by ems, an amp iof D50 was given and cbg came up to 144

## 2017-06-01 NOTE — ED Provider Notes (Addendum)
[facility] DEPT Provider Note   CSN: [id number] Arrival date & time: 06/01/17  2144     History   Chief Complaint Chief Complaint  Patient presents with  . Seizures    HPI Todd Aguilar is a 21 y.o. male.  Patient brought in by EMS. Patient was at a retreat in the [place] area. EMS was called for unconsciousness and seizure-like activity. Patient blood sugar was noted to be 61. EMS gave an amp of D50 blood sugar came up to 144. Patient was diaphoretic. Patient also given Ativan a total of 2 mg IV in route to the emergency department for the seizure-like activity.  EMS reported diaphoresis but denied any of incontinence and there was no tongue biting.  Patient's chart was reviewed. 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 Which affects coordination. Patient also had admission in April 2018 to behavioral health for suicidal ideation had the diagnosis of depression.  Patient upon arrival non-community care. But shaking activity and behavior seems to be nonepileptic seizure also not consistent with focal seizure.      Past Medical History:  Diagnosis Date  . Anxiety   . Depression   . Muscular disease     Patient Active Problem List   Diagnosis Date Noted  . MDD (major depressive disorder), single episode, severe with psychotic features (HCC) 02/19/2017    Past Surgical History:  Procedure Laterality Date  . CLOSED REDUCTION FINGER WITH PERCUTANEOUS PINNING Left 11/25/2016   Procedure: CLOSED REDUCTION WITH PERCUTANEOUS PINNING - INDEX AND LONG FINGERS;  Surgeon: Bradly Bienenstock, MD;  Location: [facility] OR;  Service: Orthopedics;  Laterality: Left;  LEFT INDEX AND LONG FINGERS  . TONSILLECTOMY         Home Medications    Prior to Admission medications   Not on File    Family History History reviewed. No pertinent family history.  Social History Social History  Substance Use Topics  . Smoking status: Heavy Tobacco Smoker   Packs/day: 0.50    Types: Cigarettes  . Smokeless tobacco: Former Neurosurgeon    Types: Chew  . Alcohol use 0.6 - 1.2 oz/week    1 - 2 Cans of beer per week     Allergies   Patient has no known allergies.   Review of Systems Review of Systems  Unable to perform ROS: Mental status change     Physical Exam Updated Vital Signs BP 119/67   Pulse 66   Temp 98.4 F (36.9 C) (Oral)   Resp 16   Ht 1.651 m (5\' 5" )   Wt 71.7 kg (158 lb)   SpO2 95%   BMI 26.29 kg/m   Physical Exam  Constitutional: He appears well-developed and well-nourished.  HENT:  Head: Normocephalic and atraumatic.  Mouth/Throat: Oropharynx is clear and moist.  Eyes: Pupils are equal, round, and reactive to light. Conjunctivae are normal.  Neck: Neck supple.  Cardiovascular: Normal heart sounds.   Pulmonary/Chest: Effort normal and breath sounds normal.  Abdominal: Soft. Bowel sounds are normal. He exhibits no distension.  Musculoskeletal: He exhibits no edema.  Neurological:  Patient not responsive. Had some tremor activity. Did not seem to be consistent with an epileptic seizure. He tried to open the patient's eyes closed and tighter. So movements did not seem to be involuntary. Patient is cloths were damp. No significant sweating at this point in time.  Skin: Skin is warm. Capillary refill takes less than 2 seconds.  Nursing note and vitals reviewed.  ED Treatments / Results  Labs (all labs ordered are listed, but only abnormal results are displayed) Labs Reviewed  CBC WITH DIFFERENTIAL/PLATELET  ETHANOL  MAGNESIUM  BASIC METABOLIC PANEL  RAPID URINE DRUG SCREEN, HOSP PERFORMED  CBG MONITORING, ED    EKG  EKG Interpretation  Date/Time:  Saturday June 01 2017 21:48:53 EDT Ventricular Rate:  70 PR Interval:    QRS Duration: 92 QT Interval:  370 QTC Calculation: 400 R Axis:   82 Text Interpretation:  Sinus rhythm Confirmed by Vanetta MuldersZackowski, Kierria Feigenbaum [phone]([id number]) on 06/01/2017 10:12:22 PM        Radiology Dg Chest Port 1 View  Result Date: 06/01/2017 CLINICAL DATA:  Status post seizure, acute onset. Loss of consciousness. Initial encounter. EXAM: PORTABLE CHEST 1 VIEW COMPARISON:  Chest radiograph performed 12/31/2003 FINDINGS: The lungs are well-aerated and clear. There is no evidence of focal opacification, pleural effusion or pneumothorax. The cardiomediastinal silhouette is within normal limits. No acute osseous abnormalities are seen. IMPRESSION: No acute cardiopulmonary process seen. Electronically Signed   By: Roanna RaiderJeffery  Chang M.D.   On: 06/01/2017 22:30   Results for orders placed or performed during the hospital encounter of 06/01/17  Ethanol  Result Value Ref Range   Alcohol, Ethyl (B) <5 <5 mg/dL  CBC with Differential  Result Value Ref Range   WBC 6.8 4.0 - 10.5 K/uL   RBC 4.68 4.22 - 5.81 MIL/uL   Hemoglobin 14.3 13.0 - 17.0 g/dL   HCT 30.840.5 65.739.0 - 84.652.0 %   MCV 86.5 78.0 - 100.0 fL   MCH 30.6 26.0 - 34.0 pg   MCHC 35.3 30.0 - 36.0 g/dL   RDW 96.212.8 95.211.5 - 84.115.5 %   Platelets 203 150 - 400 K/uL   Neutrophils Relative % 52 %   Neutro Abs 3.6 1.7 - 7.7 K/uL   Lymphocytes Relative 36 %   Lymphs Abs 2.4 0.7 - 4.0 K/uL   Monocytes Relative 7 %   Monocytes Absolute 0.5 0.1 - 1.0 K/uL   Eosinophils Relative 4 %   Eosinophils Absolute 0.3 0.0 - 0.7 K/uL   Basophils Relative 1 %   Basophils Absolute 0.1 0.0 - 0.1 K/uL  Magnesium  Result Value Ref Range   Magnesium 2.0 1.7 - 2.4 mg/dL  Basic metabolic panel  Result Value Ref Range   Sodium 139 135 - 145 mmol/L   Potassium 3.6 3.5 - 5.1 mmol/L   Chloride 105 101 - 111 mmol/L   CO2 26 22 - 32 mmol/L   Glucose, Bld 102 (H) 65 - 99 mg/dL   BUN 15 6 - 20 mg/dL   Creatinine, Ser 3.241.03 0.61 - 1.24 mg/dL   Calcium 9.7 8.9 - 40.110.3 mg/dL   GFR calc non Af Amer >60 >60 mL/min   GFR calc Af Amer >60 >60 mL/min   Anion gap 8 5 - 15  POC CBG, ED  Result Value Ref Range   Glucose-Capillary 99 65 - 99 mg/dL     Procedures Procedures (including critical care time)  CRITICAL CARE Performed by: Cherylee Rawlinson Total critical care time: 30 minutes Critical care time was exclusive of separately billable procedures and treating other patients. Critical care was necessary to treat or prevent imminent or life-threatening deterioration. Critical care was time spent personally by me on the following activities: development of treatment plan with patient and/or surrogate as well as nursing, discussions with consultants, evaluation of patient's response to treatment, examination of patient, obtaining history from patient or surrogate, ordering and  performing treatments and interventions, ordering and review of laboratory studies, ordering and review of radiographic studies, pulse oximetry and re-evaluation of patient's condition.  Medications Ordered in ED Medications - No data to display   Initial Impression / Assessment and Plan / ED Course  I have reviewed the triage vital signs and the nursing notes.  Pertinent labs & imaging results that were available during my care of the patient were reviewed by me and considered in my medical decision making (see chart for details).    After about 30 minutes being here patient did wake up and started to verbalize. York SpanielSaid that he was suicidal. Did not give any additional information. Patient's mother arrived. [facility] was [facility] [employer] [facility] [place]. He was found missing from the normal group they found him in his place of residence. He was face down and not acting normal. They felt that maybe his blood sugar was either too high or too low but patient is not a diabetic. EMS arrived blood sugar was 61 gave an amp of D50 be seen to be having seizure-like activity at that time. The D50 made no difference. They stated that he was diaphoretic.  Blood sugar here was 90 patient now acting more normal but his mother arrived. Still not verbalizing much. Vital  signs have been very normal. CBC is back without any significant abnormalities chest x-ray was also negative.   If patient is medically cleared since he voiced suicidal ideation and he has not denied that at this time patient will require evaluation by behavioral health. As stated above patient has had previous admission to behavioral health in April of this year.  Clinically do not feel that these were true seizure activities. Blood sugar of 61 should cause the symptoms. Careful blood sugar monitoring will be important to see if blood sugar drops again.  Patient has no known history of seizures.  Currently no focal neuro exam do not feel that CT head is required patient not complaining of headache currently.  Patient did not require any additional medications here. EMS and given a total of 2 mg of Ativan.  Urine drug screen is pending as well as alcohol screen.   basic metabolic panel and magnesium is pending as well.    Final Clinical Impressions(s) / ED Diagnoses   Final diagnoses:  Seizure-like activity (HCC)  Suicidal ideation    New Prescriptions New Prescriptions   No medications on file     Vanetta MuldersZackowski, Arion Shankles, MD 06/01/17 2330    Addendum:. Additional labs are returning. Blood alcohol was negative. Urine drug screen still pending. Basic metabolic panel shows a blood sugar 102, no other electrolyte abnormalities. Magnesium was normal. CBC was normal. Chest x-ray showed no acute findings. EKG showed normal sinus rhythm.  Based on these results can go and formalize the behavioral health evaluation will put in the consult.   Vanetta MuldersZackowski, Walid Haig, MD 06/01/17 2333   Patient currently being interviewed by behavioral health. Patient currently now denying any suicidal ideation. Urine drug screen still pending. Patient does have a neurologist that [place] in the [place]. If cleared by behavioral health and patient remains stable he can be discharged home and follow-up  with neurology.   Vanetta MuldersZackowski, Tobenna Needs, MD 06/02/17 0001

## 2017-06-01 NOTE — ED Notes (Signed)
Pt informed Dr Deretha EmoryZackowski that he is not suicidal at this time.

## 2017-06-02 ENCOUNTER — Inpatient Hospital Stay (HOSPITAL_COMMUNITY)
Admission: AD | Admit: 2017-06-02 | Discharge: 2017-06-05 | DRG: 885 | Disposition: A | Discharge status: Home / Self Care | Payer: BLUE CROSS/BLUE SHIELD | Attending: Psychiatry | Admitting: Psychiatry

## 2017-06-02 ENCOUNTER — Encounter (HOSPITAL_COMMUNITY): Payer: Self-pay | Admitting: *Deleted

## 2017-06-02 DIAGNOSIS — G4700 Insomnia, unspecified: Secondary | ICD-10-CM | POA: Diagnosis present

## 2017-06-02 DIAGNOSIS — F419 Anxiety disorder, unspecified: Secondary | ICD-10-CM | POA: Diagnosis present

## 2017-06-02 DIAGNOSIS — F17210 Nicotine dependence, cigarettes, uncomplicated: Secondary | ICD-10-CM | POA: Diagnosis present

## 2017-06-02 DIAGNOSIS — F332 Major depressive disorder, recurrent severe without psychotic features: Secondary | ICD-10-CM | POA: Diagnosis present

## 2017-06-02 DIAGNOSIS — R569 Unspecified convulsions: Secondary | ICD-10-CM | POA: Diagnosis not present

## 2017-06-02 LAB — RAPID URINE DRUG SCREEN, HOSP PERFORMED
Amphetamines: NOT DETECTED
Barbiturates: NOT DETECTED
Benzodiazepines: NOT DETECTED
COCAINE: NOT DETECTED
OPIATES: NOT DETECTED
Tetrahydrocannabinol: POSITIVE — AB

## 2017-06-02 LAB — GLUCOSE, CAPILLARY: Glucose-Capillary: 83 mg/dL (ref 65–99)

## 2017-06-02 LAB — CBG MONITORING, ED: Glucose-Capillary: 96 mg/dL (ref 65–99)

## 2017-06-02 MED ORDER — OLANZAPINE 10 MG PO TBDP
10.0000 mg | ORAL_TABLET | Freq: Once | ORAL | Status: DC
  Filled 2017-06-02: qty 1

## 2017-06-02 MED ORDER — TRAZODONE HCL 50 MG PO TABS
50.0000 mg | ORAL_TABLET | Freq: Every evening | ORAL | Status: DC | PRN
  Administered 2017-06-03: 50 mg via ORAL
  Filled 2017-06-02: qty 1

## 2017-06-02 MED ORDER — ACETAMINOPHEN 325 MG PO TABS: 650.0000 mg | ORAL_TABLET | Freq: Four times a day (QID) | ORAL | Status: DC | PRN

## 2017-06-02 MED ORDER — OLANZAPINE 5 MG PO TBDP
5.0000 mg | ORAL_TABLET | Freq: Every day | ORAL | Status: DC
  Filled 2017-06-02 (×3): qty 1

## 2017-06-02 MED ORDER — MAGNESIUM HYDROXIDE 400 MG/5ML PO SUSP: 30.0000 mL | Freq: Every day | ORAL | Status: DC | PRN

## 2017-06-02 MED ORDER — HYDROXYZINE HCL 25 MG PO TABS: 25.0000 mg | ORAL_TABLET | Freq: Four times a day (QID) | ORAL | Status: DC | PRN

## 2017-06-02 MED ORDER — ALUM & MAG HYDROXIDE-SIMETH 200-200-20 MG/5ML PO SUSP: 30.0000 mL | ORAL | Status: DC | PRN

## 2017-06-02 NOTE — Progress Notes (Signed)
Pt is 21 yo caucasian male who [facility]. Pt is NOT forthcoming during the interview, the majority of admission info this writer obtained has been from already documented data in pt's electronic chart and shadow chart. . History goes that pt was on   Religious retreat and found by peers with seizure-like activity. They called 911, cbg was 61, I amp d50 given and cbg 144 and pt taken to hospital, where he was witnessed to make suicidal threats, at which point pt was IVC'd. Pt refuses to make eye contact  With this writer, he refuses to answer writer's questions other than and occasional " yes" or "no" and he has refused breakfast, lunch and dinner today. HE does report that he was hospitlaized here before and he says he remembers meeting this nurse; He denies any PMH, denis taking meds, does say he smokes cigarettes and drinks alcohol and smokes pot. He has refused meds from this writer , refused dinner and at this point he is lying in a fetal position in his bed.

## 2017-06-02 NOTE — ED Notes (Signed)
Pt resting. Offered breakfast tray but pt did not want one.

## 2017-06-02 NOTE — ED Notes (Signed)
Pt has asked several times if he wants breakfast and has refused.  Pt has been asleep since beginning of shift.

## 2017-06-02 NOTE — Progress Notes (Signed)
Patient did not attended group. 

## 2017-06-02 NOTE — ED Notes (Signed)
Pt is not willing to go to [facility] voluntarily for treatment so pt was made ivc.

## 2017-06-02 NOTE — Tx Team (Signed)
Initial Treatment Plan 06/02/2017 4:50 PM Todd Mandrileter Schmieder ZOX:[id number]    PATIENT STRESSORS: Educational concerns Financial difficulties Health problems Marital or family conflict   PATIENT STRENGTHS: Ability for insight Active sense of humor Average or above average intelligence Capable of independent living   PATIENT IDENTIFIED PROBLEMS: Depression    Suicidal Ideation        " I want to die"    " I don't know what happened"     DISCHARGE CRITERIA:  Ability to meet basic life and health needs Adequate post-discharge living arrangements Improved stabilization in mood, thinking, and/or behavior  PRELIMINARY DISCHARGE PLAN: Attend aftercare/continuing care group  PATIENT/FAMILY INVOLVEMENT: This treatment plan has been presented to and reviewed with the patient, Todd Aguilar, and/or family member, .  The patient and family have been given the opportunity to ask questions and make suggestions.  Rich Braveuke, Zerenity Bowron Lynn, RN 06/02/2017, 4:50 PM

## 2017-06-02 NOTE — ED Notes (Signed)
Received report on pt, pt sitting on side of bed, update given, sitter remains at bedside,
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
## 2017-06-02 NOTE — ED Provider Notes (Signed)
[facility] Accepting physician is Dr. Jama Flavorsobos. Unfortunately, patient has become somewhat agitated in a threatening to leave. He is placed under involuntary commitment because of threats of suicide.   Todd Aguilar, Todd Manheim, MD 06/02/17 [phone]

## 2017-06-02 NOTE — Progress Notes (Signed)
Writer entered patients room and observed him lying in bed asleep. He was awakened when Clinical research associatewriter called his name. Writer asked if he would like to attend group and he declined. He remained in bed during group and declined snack also. Safety maintained on unit with 15 min checks.

## 2017-06-02 NOTE — ED Notes (Addendum)
Pt lying supine on stretcher, no distress noted, will arouse, sitter remains at bedside,

## 2017-06-02 NOTE — ED Notes (Signed)
[facility] by Dr. Jama Flavorsobos, he can arrive after 8am. Pt is going to room 404-bed 2. Call report to [phone].

## 2017-06-03 DIAGNOSIS — F17210 Nicotine dependence, cigarettes, uncomplicated: Secondary | ICD-10-CM

## 2017-06-03 DIAGNOSIS — F332 Major depressive disorder, recurrent severe without psychotic features: Principal | ICD-10-CM

## 2017-06-03 LAB — GLUCOSE, CAPILLARY
Glucose-Capillary: 82 mg/dL (ref 65–99)
Glucose-Capillary: 98 mg/dL (ref 65–99)
Glucose-Capillary: 96 mg/dL (ref 65–99)
Glucose-Capillary: 91 mg/dL (ref 65–99)

## 2017-06-03 MED ORDER — ESCITALOPRAM OXALATE 5 MG PO TABS
5.0000 mg | ORAL_TABLET | Freq: Every day | ORAL | Status: DC
  Administered 2017-06-04: 5 mg via ORAL
  Filled 2017-06-03 (×2): qty 1

## 2017-06-03 NOTE — Progress Notes (Signed)
Recreation Therapy Notes  Date: 06/03/2017 Time: 9:30am Location: [address] Dayroom  Group Topic: Stress Management  Goal Area(s) Addresses:  Patient will verbalize importance of using healthy stress management.  Patient will identify positive emotions associated with healthy stress management.   Intervention: Stress Management  Activity :  Guided Body Scan. Recreation Therapy Intern introduced the stress management technique of guided body scans. Recreation Therapy Intern played a YouTube video that allowed patients to focus on the tension built up in each part of their body. Patients were to follow along as script was read to engage in the activity.  Education: Stress Management, Discharge Planning.   Education Outcome: Acknowledges edcuation  Clinical Observations/Feedback: Pt did not attend group.  Rachel Meyer, Recreation Therapy Intern    Anfernee Peschke, LRT/CTRS   
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
## 2017-06-03 NOTE — Progress Notes (Signed)
Pt rated his day a 6 out of 10. Pt goal for tomorrow is to go home.

## 2017-06-03 NOTE — [facility] Suicide Risk Assessment (Signed)
[facility] INPATIENT:  Family/Significant Other Suicide Prevention Education  Suicide Prevention Education:  Education Completed; Todd Aguilar, [name] [phone], has been identified by the patient as the family member/significant other with whom the patient will be residing, and identified as the person(s) who will aid the patient in the event of a mental health crisis (suicidal ideations/suicide attempt).  With written consent from the patient, the family member/significant other has been provided the following suicide prevention education, prior to the and/or following the discharge of the patient.  The suicide prevention education provided includes the following:  Suicide risk factors  Suicide prevention and interventions  National Suicide Hotline telephone number  [facility] assessment telephone number  [facility] Assistance 911  [facility] and/or Residential Mobile Crisis Unit telephone number  Request made of family/significant other to:  Remove weapons (e.g., guns, rifles, knives), all items previously/currently identified as safety concern.    Remove drugs/medications (over-the-counter, prescriptions, illicit drugs), all items previously/currently identified as a safety concern.  The family member/significant other verbalizes understanding of the suicide prevention education information provided.  The family member/significant other agrees to remove the items of safety concern listed above.  [name] reported that she was surprised that Pt was admitted as when Pt presented to the ED, it was for blood sugar and seizure-like activity that occurred while Pt was on a trip. [name] is supportive of patient and feels that Pt was doing better at home over the last week and Pt was engaging in treatment. [name] would like for Pt to come home as soon as possible.   Todd LennertLauren C Haydin Aguilar 06/03/2017, 3:24 PM

## 2017-06-03 NOTE — Plan of Care (Signed)
Problem: Coping: Goal: Ability to cope will improve Outcome: Not Progressing Patient is currently not interested in attending groups to learn effective coping skills.

## 2017-06-03 NOTE — Plan of Care (Signed)
Problem: Activity: Goal: Interest or engagement in activities will improve Outcome: Not Progressing Patient is currently not interested or engaged in activities or groups on the unit.

## 2017-06-03 NOTE — [facility] Group Notes (Signed)
[facility]  06/03/2017 8:45 AM  Participation Quality: Pt did not attend; sleeping in room.   Vernie ShanksLauren Rilei Kravitz, LCSW 06/03/2017 12:58 PM

## 2017-06-03 NOTE — [facility] Group Notes (Signed)
[facility] LCSW Group Therapy Note  Date/Time: 06/03/17 at 1:15Pm  Type of Therapy and Topic:  Group Therapy:  Overcoming Obstacles  Participation Level:  Patient did not attend group on today.   Fernande BoydenJoyce Zaida Reiland, LCSWA Clinical Social Worker [facility] Ph: [phone]

## 2017-06-03 NOTE — Progress Notes (Signed)
D: Patient observed resting in bed, isolative to bed, room. Minimal response during assessment with one word answers. Ear plugs in and patient reluctant to remove them. Frequent contacts made 1:1 throughout shift. Patient verbalizes to this Clinical research associatewriter "I'm fine. I don't need anything." Patient's affect blank, flat, mood slightly irritable. Denies having a goal and states, "I cannot think of one." Denies pain, physical problems.   A: Patient not on any scheduled meds this shift and denies need for prns. Monitoring CBGs as patient's intake is low and CBG was low in ED. Level III obs in place for safety. Emotional support offered and self inventory reviewed. Encouraged completion of Suicide Safety Plan and programming participation. Discussed POC with MD, SW.  R: CBG WDLs at this time. Patient verbalizes understanding of POC. Patient denies SI/HI/AVH and remains safe on level III obs. Will continue to monitor closely and make verbal contact frequently. Will continue to encourage participation and visibility in the milieu.
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
## 2017-06-03 NOTE — Progress Notes (Signed)
Patient observed up and about this afternoon, evening. Interacting with male peers and completed shower. CBG at dinner was "96." Patient continues to be minimal in his interactions with staff but denies needs, issues currently. Will continue to monitor.

## 2017-06-03 NOTE — Plan of Care (Signed)
Problem: Education: Goal: Verbalization of understanding the information provided will improve Outcome: Not Progressing Patient with minimal interaction, verbalizes little during assessment.  Problem: Safety: Goal: Periods of time without injury will increase Outcome: Progressing Patient has not engaged in self harm, denies SI.
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
## 2017-06-04 LAB — GLUCOSE, CAPILLARY: Glucose-Capillary: 99 mg/dL (ref 65–99)

## 2017-06-04 MED ORDER — MIRTAZAPINE 7.5 MG PO TABS
7.5000 mg | ORAL_TABLET | Freq: Every day | ORAL | Status: DC
  Administered 2017-06-04: 7.5 mg via ORAL
  Filled 2017-06-04 (×2): qty 1

## 2017-06-04 NOTE — Progress Notes (Signed)
Todd Aguilar. Todd Aguilar had been up and visible in milieu this evening, did attend and participate in group activity, spoke about how he had a good day and spoke about being hopeful for discharge in the morning. He was seen interacting appropriately with peers in milieu, received bedtime medication without incident, did not verbalize any complaints of pain and denied any SI this evening. A. Support and encouragement provided. R. Safety maintained, will continue to monitor.
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
## 2017-06-04 NOTE — Progress Notes (Signed)
Patient ID: Todd Aguilar, male   DOB: [birth year], 21 y.o.   MRN: [id number] Patient denies SI/HI/AVH, was encouraged this morning to complete self inventory sheet, but has not turned it in.  Patient calm/cooperative with care, affect blunted, pt reports a good appetite, reports a +BM today, denies having any anxiety, reports his concentration today as being good, denies having any anxiety, denies pain, is focused on going home.  Pt reports his goal being to be discharged.  Pt lacks insight into what to do to avoid future hospitalizations. Q15 minute checks in place, will continue to monitor.

## 2017-06-04 NOTE — Progress Notes (Signed)
Hulda Marin. Todd Aguilar had been up and visible in milieu this evening, spoke about how he got here and how he felt that it was a mistake that he got here. He denied any SI this evening and spoke about how he is hopeful to leave relatively soon. He denied any physical pain and did request and receive medication to assist with sleep. A. Support and encouragement provided. R. Safety maintained, will continue to monitor.

## 2017-06-04 NOTE — [facility] Group Notes (Signed)
[facility] LCSW Group Therapy 06/04/2017 1:15 PM  Type of Therapy: Group Therapy- Feelings about Diagnosis  Pt did not attend, declined invitation.   Vernie ShanksLauren Ixchel Duck, LCSW 06/04/2017 4:18 PM

## 2017-06-05 MED ORDER — HYDROXYZINE HCL 25 MG PO TABS: 25.0000 mg | ORAL_TABLET | Freq: Four times a day (QID) | ORAL | 0 refills | Status: AC | PRN

## 2017-06-05 MED ORDER — MIRTAZAPINE 7.5 MG PO TABS: 7.5000 mg | ORAL_TABLET | Freq: Every day | ORAL | 0 refills | Status: AC

## 2017-06-05 NOTE — Progress Notes (Signed)
  [facility] :  Will you be returning to the same living situation after discharge:  Yes,  Pt returning home At discharge, do you have transportation home?: Yes,  Pt mother to pick up Do you have the ability to pay for your medications: Yes,  Pt provided with prescriptions  Release of information consent forms completed and in the chart;  Patient's signature needed at discharge.  Patient to Follow up at: Follow-up Information    [facility] Follow up on 06/06/2017.   Why:  at 4:00pm for therapy with Al CorpusMichie Dew Contact information: [address] [place] [address] [phone]        [facility] Follow up on 06/11/2017.   Specialty:  Family Medicine Why:  at 2:45pm with Eilene Ghaziourtney Forcuccia, PA for medication management. Contact information: [address] [place] [address] [phone]           Next level of care provider [facility] Link:no  Safety Planning and Suicide Prevention discussed: Yes,  with mother; see SPE note  Have you used any form of tobacco in the last 30 days? (Cigarettes, Smokeless Tobacco, Cigars, and/or Pipes): Yes  Has patient been referred to the Quitline?: Patient refused referral  Patient has been referred for addiction treatment: Yes  Verdene LennertLauren C Zyan Mirkin, LCSW 06/05/2017, 9:28 AM
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
## 2017-06-05 NOTE — Progress Notes (Signed)
Recreation Therapy Notes  Date: 06/05/2017 Time: 9:30am Location: [address] Dayroom  Group Topic: Stress Management  Goal Area(s) Addresses:  Patient will verbalize importance of using healthy stress management.  Patient will identify positive emotions associated with healthy stress management.   Intervention: Stress Management  Activity : Guided Energy Starter. Recreation Therapy Intern introduced the stress management technique of a guided energy starter. Recreation Therapy Intern read a script that allowed patients to work on stretching and relaxing some muscles to help them feel energized. Recreation Therapy Intern played calming music. Patients were to follow along as script was read to engage in the activity.  Education: Stress Management, Discharge Planning.   Education Outcome: Needs additional edcuation  Clinical Observations/Feedback: Pt did not attend group.  Rachel Meyer, Recreation Therapy Intern   Mendel Binsfeld, LRT/CTRS  
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
## 2017-06-05 NOTE — Tx Team (Signed)
Interdisciplinary Treatment and Diagnostic Plan Update  06/05/2017 Time of Session: 9:30am Geoff Dacanay MRN: [id number]  Principal Diagnosis: MDD (major depressive disorder), recurrent severe, without psychosis (HCC)  Secondary Diagnoses: Principal Problem:   MDD (major depressive disorder), recurrent severe, without psychosis (HCC)   Current Medications:  Current Facility-Administered Medications  Medication Dose Route Frequency Provider Last Rate Last Dose  . acetaminophen (TYLENOL) tablet 650 mg  650 mg Oral Q6H PRN Laveda Abbe, NP      . alum & mag hydroxide-simeth (MAALOX/MYLANTA) 200-200-20 MG/5ML suspension 30 mL  30 mL Oral Q4H PRN Laveda Abbe, NP      . hydrOXYzine (ATARAX/VISTARIL) tablet 25 mg  25 mg Oral Q6H PRN Laveda Abbe, NP      . magnesium hydroxide (MILK OF MAGNESIA) suspension 30 mL  30 mL Oral Daily PRN Laveda Abbe, NP      . mirtazapine (REMERON) tablet 7.5 mg  7.5 mg Oral QHS Cobos, Rockey Situ, MD   7.5 mg at 06/04/17 2247  . OLANZapine zydis (ZYPREXA) disintegrating tablet 10 mg  10 mg Oral Once Laveda Abbe, NP        PTA Medications: No prescriptions prior to admission.    Treatment Modalities: Medication Management, Group therapy, Case management,  1 to 1 session with clinician, Psychoeducation, Recreational therapy.  Patient Stressors: [employer] fast streamer difficulties Health problems Marital or family conflict  Patient Strengths: Ability for insight Active sense of humor Average or above average intelligence Capable of independent living  Physician Treatment Plan for Primary Diagnosis: MDD (major depressive disorder), recurrent severe, without psychosis (HCC) Long Term Goal(s): Improvement in symptoms so as ready for discharge  Short Term Goals: Ability to disclose and discuss suicidal ideas Ability to verbalize feelings will improve Ability to identify and develop effective coping  behaviors will improve  Medication Management: Evaluate patient's response, side effects, and tolerance of medication regimen.  Therapeutic Interventions: 1 to 1 sessions, Unit Group sessions and Medication administration.  Evaluation of Outcomes: Adequate for Discharge  Physician Treatment Plan for Secondary Diagnosis: Principal Problem:   MDD (major depressive disorder), recurrent severe, without psychosis (HCC)   Long Term Goal(s): Improvement in symptoms so as ready for discharge  Short Term Goals: Ability to disclose and discuss suicidal ideas Ability to verbalize feelings will improve Ability to identify and develop effective coping behaviors will improve  Medication Management: Evaluate patient's response, side effects, and tolerance of medication regimen.  Therapeutic Interventions: 1 to 1 sessions, Unit Group sessions and Medication administration.  Evaluation of Outcomes: Adequate for Discharge   RN Treatment Plan for Primary Diagnosis: MDD (major depressive disorder), recurrent severe, without psychosis (HCC) Long Term Goal(s): Knowledge of disease and therapeutic regimen to maintain health will improve  Short Term Goals: Ability to remain free from injury will improve, Ability to verbalize frustration and anger appropriately will improve, Ability to participate in decision making will improve, Ability to disclose and discuss suicidal ideas, Ability to identify and develop effective coping behaviors will improve and Compliance with prescribed medications will improve  Medication Management: RN will administer medications as ordered by provider, will assess and evaluate patient's response and provide education to patient for prescribed medication. RN will report any adverse and/or side effects to prescribing provider.  Therapeutic Interventions: 1 on 1 counseling sessions, Psychoeducation, Medication administration, Evaluate responses to treatment, Monitor vital signs and  CBGs as ordered, Perform/monitor CIWA, COWS, AIMS and Fall Risk screenings as ordered, Perform wound care treatments  as ordered.  Evaluation of Outcomes: Adequate for Discharge   LCSW Treatment Plan for Primary Diagnosis: MDD (major depressive disorder), recurrent severe, without psychosis (HCC) Long Term Goal(s): Safe transition to appropriate next level of care at discharge, Engage patient in therapeutic group addressing interpersonal concerns.  Short Term Goals: Engage patient in aftercare planning with referrals and resources, Identify triggers associated with mental health/substance abuse issues and Increase skills for wellness and recovery  Therapeutic Interventions: Assess for all discharge needs, 1 to 1 time with Social worker, Explore available resources and support systems, Assess for adequacy in community support network, Educate family and significant other(s) on suicide prevention, Complete Psychosocial Assessment, Interpersonal group therapy.  Evaluation of Outcomes: Adequate for Discharge   Progress in Treatment: Attending groups: No Participating in groups: No  Taking medication as prescribed: Yes, MD continues to assess for medication changes as needed Toleration medication: Yes, no side effects reported at this time Family/Significant other contact made: Yes with mother Patient understands diagnosis: Developing insight AEB willingness to start medication trial and continue in outpatient therapy Discussing patient identified problems/goals with staff: Yes Medical problems stabilized or resolved: Yes Denies suicidal/homicidal ideation: Yes Issues/concerns per patient self-inventory: None Other: N/A  New problem(s) identified: None identified at this time.   New Short Term/Long Term Goal(s): "have a clearer mind and work on being more positive"  Discharge Plan or Barriers: Pt will return home and follow-[facility] for  therapy and PCP for medication management.   Reason for Continuation of Hospitalization: Anxiety Depression Medication stabilization  Estimated Length of Stay: 0 days; Pt stable for DC today  Attendees: Patient: Todd Aguilar  06/05/2017  10:36 AM  Physician: Dr. Jama Flavorsobos 06/05/2017  10:36 AM  Nursing: Vesta Mixeraroline B., RN 06/05/2017  10:36 AM  RN Care Manager: Onnie BoerJennifer Clark, RN 06/05/2017  10:36 AM  Social Worker: Vernie ShanksLauren Alonso Gapinski, LCSW 06/05/2017  10:36 AM  Recreational Therapist:  06/05/2017  10:36 AM  Other: Reola Calkinsravis Money, NP 06/05/2017  10:36 AM  Other:  06/05/2017  10:36 AM  Other: 06/05/2017  10:36 AM    Scribe for Treatment Team: Verdene LennertLauren C Elbia Paro, LCSW 06/05/2017 10:36 AM

## 2017-06-05 NOTE — Progress Notes (Signed)
Adult Psychoeducational Group Note  Date:  06/05/2017 Time:  12:58 AM  Group Topic/Focus:  Wrap-Up Group:   The focus of this group is to help patients review their daily goal of treatment and discuss progress on daily workbooks.  Participation Level:  Active  Participation Quality:  Appropriate  Affect:  Appropriate  Cognitive:  Oriented  Insight: Good  Engagement in Group:  Engaged  Modes of Intervention:  Activity  Additional Comments:  Pt rated his day a 6. Goal is to get back to work upon discharge.  Claria DiceKiara M Edith Groleau 06/05/2017, 12:58 AM

## 2017-06-05 NOTE — Progress Notes (Signed)
Patient ID: Todd Aguilar, male   DOB: January 09, 1996, 21 y.o.   MRN: [id number]  D: Assumed care patient @ 0100. Patient in bed sleeping. Respiration regular and unlabored. No sign of distress noted at this time A: 15 mins checks for safety. R: Patient remains safe.

## 2017-06-05 NOTE — Progress Notes (Signed)
Discharge note: Patient discharged home per MD order.  Patient received all personal belongings from unit and locker.  Patient denies any thoughts of self harm.  He denies HI and does not appear to be responding to internal stimuli.  Patient will follow up with [facility].  Patient left ambulatory with his mother.

## 2017-06-25 ENCOUNTER — Emergency Department (HOSPITAL_COMMUNITY)
Admission: EM | Admit: 2017-06-25 | Discharge: 2017-06-26 | Disposition: A | Discharge status: Other Acute Inpatient Hospital | Payer: BLUE CROSS/BLUE SHIELD | Attending: Emergency Medicine | Admitting: Emergency Medicine

## 2017-06-25 DIAGNOSIS — F332 Major depressive disorder, recurrent severe without psychotic features: Secondary | ICD-10-CM | POA: Insufficient documentation

## 2017-06-25 DIAGNOSIS — X58XXXA Exposure to other specified factors, initial encounter: Secondary | ICD-10-CM | POA: Diagnosis not present

## 2017-06-25 DIAGNOSIS — Y939 Activity, unspecified: Secondary | ICD-10-CM | POA: Insufficient documentation

## 2017-06-25 DIAGNOSIS — F17210 Nicotine dependence, cigarettes, uncomplicated: Secondary | ICD-10-CM | POA: Diagnosis not present

## 2017-06-25 DIAGNOSIS — T1491XA Suicide attempt, initial encounter: Secondary | ICD-10-CM | POA: Insufficient documentation

## 2017-06-25 DIAGNOSIS — Y999 Unspecified external cause status: Secondary | ICD-10-CM | POA: Diagnosis not present

## 2017-06-25 DIAGNOSIS — Y929 Unspecified place or not applicable: Secondary | ICD-10-CM | POA: Insufficient documentation

## 2017-06-25 DIAGNOSIS — T50902A Poisoning by unspecified drugs, medicaments and biological substances, intentional self-harm, initial encounter: Secondary | ICD-10-CM | POA: Diagnosis present

## 2017-06-25 LAB — COMPREHENSIVE METABOLIC PANEL
ALT: 17 U/L (ref 17–63)
ANION GAP: 8 (ref 5–15)
AST: 21 U/L (ref 15–41)
Albumin: 5 g/dL (ref 3.5–5.0)
Alkaline Phosphatase: 64 U/L (ref 38–126)
BUN: 11 mg/dL (ref 6–20)
CALCIUM: 9.9 mg/dL (ref 8.9–10.3)
CHLORIDE: 106 mmol/L (ref 101–111)
CO2: 25 mmol/L (ref 22–32)
Creatinine, Ser: 0.85 mg/dL (ref 0.61–1.24)
GFR calc non Af Amer: >60 mL/min (ref >60)
Glucose, Bld: 85 mg/dL (ref 65–99)
POTASSIUM: 3.9 mmol/L (ref 3.5–5.1)
SODIUM: 139 mmol/L (ref 135–145)
Total Bilirubin: 0.6 mg/dL (ref 0.3–1.2)
Total Protein: 8.1 g/dL (ref 6.5–8.1)

## 2017-06-25 LAB — ACETAMINOPHEN LEVEL: Acetaminophen (Tylenol), Serum: <10 ug/mL — ABNORMAL LOW (ref 10–30)

## 2017-06-25 LAB — CBC WITH DIFFERENTIAL/PLATELET
Basophils Absolute: 0 10*3/uL (ref 0.0–0.1)
Basophils Relative: 1 %
EOS ABS: 0.2 10*3/uL (ref 0.0–0.7)
EOS PCT: 4 %
HCT: 39.6 % (ref 39.0–52.0)
Hemoglobin: 14.2 g/dL (ref 13.0–17.0)
LYMPHS ABS: 1.9 10*3/uL (ref 0.7–4.0)
Lymphocytes Relative: 36 %
MCH: 30.7 pg (ref 26.0–34.0)
MCHC: 35.9 g/dL (ref 30.0–36.0)
MCV: 85.5 fL (ref 78.0–100.0)
MONO ABS: 0.3 10*3/uL (ref 0.1–1.0)
Monocytes Relative: 6 %
Neutro Abs: 2.9 10*3/uL (ref 1.7–7.7)
Neutrophils Relative %: 53 %
PLATELETS: 237 10*3/uL (ref 150–400)
RBC: 4.63 MIL/uL (ref 4.22–5.81)
RDW: 12.9 % (ref 11.5–15.5)
WBC: 5.4 10*3/uL (ref 4.0–10.5)

## 2017-06-25 LAB — ETHANOL: Alcohol, Ethyl (B): <5 mg/dL (ref <5)

## 2017-06-25 NOTE — ED Notes (Signed)
TTS in progress 

## 2017-06-25 NOTE — ED Notes (Signed)
Patient continues to endorse SI however he is able to contract for safety while on unit. Patient currently denies HI and AVH. Plan of care discussed with patient. Encouragement and support provided and safety maintain. Q 15 min safety checks in place and video monitoring.

## 2017-06-25 NOTE — ED Notes (Addendum)
Pt stated "I've spent the last 2 months living out of my car.  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  I'm not perfect like the other 6 children are.  I only got a 30 day supply the last time I was here.  My parents couldn't tell the meds were helping me."

## 2017-06-25 NOTE — ED Notes (Signed)
Poison control has closed patient case he is medically cleared.

## 2017-06-25 NOTE — ED Triage Notes (Signed)
Patient arrived per family car, mother reports that he recently seen the doctor for muscle pain, and was given baclofen 5 mg number 40. When mother went to see patient this afternoon all of the pills had been taken. Patient states that he took them last night 06/24/17  around 2000. NAD noted.
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
## 2017-06-25 NOTE — ED Notes (Signed)
Patient placed in purple scrubs, and wanded by security. Patients clothes placed in locker # 30.

## 2017-06-25 NOTE — ED Notes (Signed)
Notified Poison control of ingestion of baclofen 5 mg # 39 tablets. Recommend acetaminophen level, EKG( completed). Labs pending.
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
## 2017-06-25 NOTE — ED Provider Notes (Signed)
[facility]-EMERGENCY DEPT Provider Note   CSN: [id number] Arrival date & time: 06/25/17  1548     History   Chief Complaint Chief Complaint  Patient presents with  . Drug Overdose    HPI Todd Aguilar is a 21 y.o. male.  The history is provided by the patient.  Mental Health Problem  Presenting symptoms: suicidal thoughts, suicidal threats and suicide attempt   Degree of incapacity (severity):  Moderate Onset quality:  Gradual Timing:  Intermittent Progression:  Waxing and waning Chronicity:  Recurrent Context: noncompliance and stressful life event   Relieved by:  Anti-anxiety medications and antipsychotics Associated symptoms: feelings of worthlessness and irritability   Risk factors: hx of mental illness, hx of suicide attempts (reports prior attempts with carbon monoxide, overdose, bleach.) and recent psychiatric admission    States that he took a handful of different pills from the medicine cabinet approximately at 2000 last night in addition to 4 small bottles of wine in a suicide attempt. History of prior attempts.  Some of the pills that he took 4 baclofen. Possibly to 39 pills of 5 mg of baclofen.  Patient denied any altered mental status, chest pain, palpitations, shortness of breath, nausea, vomiting, abdominal pain.  Past Medical History:  Diagnosis Date  . Anxiety   . Depression   . Muscular disease     Patient Active Problem List   Diagnosis Date Noted  . MDD (major depressive disorder), recurrent severe, without psychosis (HCC) 06/02/2017  . MDD (major depressive disorder), single episode, severe with psychotic features (HCC) 02/19/2017    Past Surgical History:  Procedure Laterality Date  . CLOSED REDUCTION FINGER WITH PERCUTANEOUS PINNING Left 11/25/2016   Procedure: CLOSED REDUCTION WITH PERCUTANEOUS PINNING - INDEX AND LONG FINGERS;  Surgeon: Bradly Bienenstock, MD;  Location: [facility] OR;  Service: Orthopedics;  Laterality: Left;  LEFT INDEX AND LONG FINGERS  .  TONSILLECTOMY         Home Medications    Prior to Admission medications   Medication Sig Start Date End Date Taking? Authorizing Provider  hydrOXYzine (ATARAX/VISTARIL) 25 MG tablet Take 1 tablet (25 mg total) by mouth every 6 (six) hours as needed for anxiety. Patient not taking: Reported on 06/25/2017 06/05/17   Money, Gerlene Burdock, FNP  mirtazapine (REMERON) 7.5 MG tablet Take 1 tablet (7.5 mg total) by mouth at bedtime. Patient not taking: Reported on 06/25/2017 06/05/17   Money, Gerlene Burdock, FNP    Family History Family History  Problem Relation Age of Onset  . Adopted: Yes    Social History Social History  Substance Use Topics  . Smoking status: Heavy Tobacco Smoker    Packs/day: 0.50    Years: 1.00    Types: Cigarettes  . Smokeless tobacco: Former Neurosurgeon    Types: Chew  . Alcohol use Not on file     Allergies   Patient has no known allergies.   Review of Systems Review of Systems  Constitutional: Positive for irritability.  Psychiatric/Behavioral: Positive for suicidal ideas.  All other systems are reviewed and are negative for acute change except as noted in the HPI    Physical Exam Updated Vital Signs BP (!) 143/88 (BP Location: Left Arm)   Pulse 66   Temp 98.4 F (36.9 C) (Oral)   Resp 16   SpO2 99%   Physical Exam  Constitutional: He is oriented to person, place, and time. He appears well-developed and well-nourished. No distress.  HENT:  Head: Normocephalic and atraumatic.  Nose: Nose  normal.  Eyes: Pupils are equal, round, and reactive to light. Conjunctivae and EOM are normal. Right eye exhibits no discharge. Left eye exhibits no discharge. No scleral icterus.  Neck: Normal range of motion. Neck supple.  Cardiovascular: Normal rate and regular rhythm.  Exam reveals no gallop and no friction rub.   No murmur heard. Pulmonary/Chest: Effort normal and breath sounds normal. No stridor. No respiratory distress. He has no rales.  Abdominal: Soft. He  exhibits no distension. There is no tenderness.  Musculoskeletal: He exhibits no edema or tenderness.  Neurological: He is alert and oriented to person, place, and time.  Skin: Skin is warm and dry. No rash noted. He is not diaphoretic. No erythema.  Psychiatric: He has a normal mood and affect. His speech is normal. He is agitated. Thought content is not paranoid and not delusional. He expresses suicidal ideation. He expresses no homicidal ideation. He expresses no homicidal plans.  Vitals reviewed.    ED Treatments / Results  Labs (all labs ordered are listed, but only abnormal results are displayed) Labs Reviewed - No data to display  EKG  EKG Interpretation None       Radiology No results found.  Procedures Procedures (including critical care time)  Medications Ordered in ED Medications - No data to display   Initial Impression / Assessment and Plan / ED Course  I have reviewed the triage vital signs and the nursing notes.  Pertinent labs & imaging results that were available during my care of the patient were reviewed by me and considered in my medical decision making (see chart for details).     High risk patient stating he is here for trying to overdose on a lot of different pills.   Will IVC to medically clear and [facility] evaluation. Will discuss case with poison control.  Patient medical care for behavior health evaluation and management  Final Clinical Impressions(s) / ED Diagnoses   Final diagnoses:  Suicide attempt [facility])      Cardama, Amadeo Garnet, MD 06/26/17 [phone]

## 2017-06-26 LAB — RAPID URINE DRUG SCREEN, HOSP PERFORMED
Amphetamines: POSITIVE — AB
BARBITURATES: NOT DETECTED
Benzodiazepines: NOT DETECTED
Cocaine: NOT DETECTED
Opiates: NOT DETECTED
Tetrahydrocannabinol: POSITIVE — AB

## 2017-06-26 NOTE — [facility] Assessment (Signed)
[facility] Assessment Progress Note  Per Thedore MinsMojeed Akintayo, MD, this pt requires psychiatric hospitalization at this time.  Pt presents under IVC initiated by EDP Nira ConnPedro Eduardo Cardama, MD.  At 08:48 Jill AlexandersJustin calls from [facility] to report that pt has been accepted to their facility by Dr Wendall StadeKohl.  Dr Jannifer FranklinAkintayo concurs with this decision.  Pt's nurse, Lincoln MaxinOlivette, has been notified, and agrees to call report to [phone].  [facility]  Doylene Canninghomas Emiah Pellicano, MA Triage Specialist [phone]

## 2017-07-06 ENCOUNTER — Emergency Department (HOSPITAL_COMMUNITY): Payer: BLUE CROSS/BLUE SHIELD

## 2017-07-06 ENCOUNTER — Emergency Department (HOSPITAL_COMMUNITY)
Admission: EM | Admit: 2017-07-06 | Discharge: 2017-07-06 | Disposition: A | Discharge status: Home / Self Care | Payer: BLUE CROSS/BLUE SHIELD | Attending: Emergency Medicine | Admitting: Emergency Medicine

## 2017-07-06 DIAGNOSIS — W19XXXA Unspecified fall, initial encounter: Secondary | ICD-10-CM

## 2017-07-06 DIAGNOSIS — F17210 Nicotine dependence, cigarettes, uncomplicated: Secondary | ICD-10-CM | POA: Diagnosis not present

## 2017-07-06 DIAGNOSIS — Y929 Unspecified place or not applicable: Secondary | ICD-10-CM | POA: Insufficient documentation

## 2017-07-06 DIAGNOSIS — Y9301 Activity, walking, marching and hiking: Secondary | ICD-10-CM | POA: Insufficient documentation

## 2017-07-06 DIAGNOSIS — Y999 Unspecified external cause status: Secondary | ICD-10-CM | POA: Diagnosis not present

## 2017-07-06 DIAGNOSIS — S76211A Strain of adductor muscle, fascia and tendon of right thigh, initial encounter: Secondary | ICD-10-CM | POA: Diagnosis not present

## 2017-07-06 DIAGNOSIS — W172XXA Fall into hole, initial encounter: Secondary | ICD-10-CM | POA: Diagnosis not present

## 2017-07-06 DIAGNOSIS — S76219A Strain of adductor muscle, fascia and tendon of unspecified thigh, initial encounter: Secondary | ICD-10-CM

## 2017-07-06 DIAGNOSIS — S79921A Unspecified injury of right thigh, initial encounter: Secondary | ICD-10-CM | POA: Diagnosis present

## 2017-07-06 MED ORDER — IBUPROFEN 800 MG PO TABS
800.0000 mg | ORAL_TABLET | Freq: Once | ORAL | Status: AC
  Administered 2017-07-06: 800 mg via ORAL
  Filled 2017-07-06: qty 1

## 2017-07-06 NOTE — Discharge Instructions (Signed)
Please use your crutches when walking to reduce pressure on the right hip and knee. You may take 800 mg of ibuprofen with food every 8 hours as needed for pain control. Please apply ice for 15-20 minutes up to 3-4 times a day.   Please follow-up with your primary care provider if your symptoms do not start to improve in the next 3-5 days.  I have provided you with a list of shelter resources in the area.   If you develop new or worsening symptoms, including fever, chills, or any fall or injury, please return to the emergency department for reevaluation.

## 2017-07-06 NOTE — ED Notes (Signed)
Pt returned from X-ray.  

## 2017-07-06 NOTE — ED Notes (Signed)
Patient transported to X-ray 

## 2017-07-06 NOTE — ED Notes (Signed)
With PA and RN in room, pt is complaining of groin pain.
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
## 2017-07-22 ENCOUNTER — Emergency Department (HOSPITAL_COMMUNITY)
Admission: EM | Admit: 2017-07-22 | Discharge: 2017-07-22 | Discharge status: Absent without leave | Payer: BLUE CROSS/BLUE SHIELD

## 2017-07-22 ENCOUNTER — Encounter (HOSPITAL_COMMUNITY): Payer: Self-pay | Admitting: *Deleted

## 2017-07-22 ENCOUNTER — Ambulatory Visit (HOSPITAL_COMMUNITY)
Admission: RE | Admit: 2017-07-22 | Discharge: 2017-07-22 | Disposition: A | Discharge status: Home / Self Care | Payer: BLUE CROSS/BLUE SHIELD | Source: Home / Self Care | Attending: Psychiatry | Admitting: Psychiatry

## 2017-07-22 ENCOUNTER — Emergency Department (HOSPITAL_COMMUNITY)
Admission: EM | Admit: 2017-07-22 | Discharge: 2017-07-23 | Disposition: A | Discharge status: Home / Self Care | Payer: BLUE CROSS/BLUE SHIELD | Attending: Emergency Medicine | Admitting: Emergency Medicine

## 2017-07-22 DIAGNOSIS — R45851 Suicidal ideations: Secondary | ICD-10-CM | POA: Insufficient documentation

## 2017-07-22 DIAGNOSIS — M629 Disorder of muscle, unspecified: Secondary | ICD-10-CM | POA: Diagnosis not present

## 2017-07-22 DIAGNOSIS — F329 Major depressive disorder, single episode, unspecified: Secondary | ICD-10-CM

## 2017-07-22 DIAGNOSIS — F17210 Nicotine dependence, cigarettes, uncomplicated: Secondary | ICD-10-CM | POA: Insufficient documentation

## 2017-07-22 DIAGNOSIS — F32A Depression, unspecified: Secondary | ICD-10-CM

## 2017-07-22 DIAGNOSIS — F332 Major depressive disorder, recurrent severe without psychotic features: Secondary | ICD-10-CM | POA: Diagnosis not present

## 2017-07-22 DIAGNOSIS — F333 Major depressive disorder, recurrent, severe with psychotic symptoms: Secondary | ICD-10-CM

## 2017-07-22 DIAGNOSIS — Z590 Homelessness: Secondary | ICD-10-CM

## 2017-07-22 DIAGNOSIS — F419 Anxiety disorder, unspecified: Secondary | ICD-10-CM | POA: Diagnosis not present

## 2017-07-22 LAB — COMPREHENSIVE METABOLIC PANEL
ALBUMIN: 4.2 g/dL (ref 3.5–5.0)
ALT: 14 U/L — AB (ref 17–63)
AST: 22 U/L (ref 15–41)
Alkaline Phosphatase: 60 U/L (ref 38–126)
Anion gap: 9 (ref 5–15)
BILIRUBIN TOTAL: 0.5 mg/dL (ref 0.3–1.2)
BUN: 14 mg/dL (ref 6–20)
CHLORIDE: 107 mmol/L (ref 101–111)
CO2: 23 mmol/L (ref 22–32)
Calcium: 9.4 mg/dL (ref 8.9–10.3)
Creatinine, Ser: 0.84 mg/dL (ref 0.61–1.24)
GFR calc Af Amer: >60 mL/min (ref >60)
GFR calc non Af Amer: >60 mL/min (ref >60)
GLUCOSE: 87 mg/dL (ref 65–99)
POTASSIUM: 4.3 mmol/L (ref 3.5–5.1)
Sodium: 139 mmol/L (ref 135–145)
TOTAL PROTEIN: 6.8 g/dL (ref 6.5–8.1)

## 2017-07-22 LAB — ETHANOL: Alcohol, Ethyl (B): <5 mg/dL (ref <5)

## 2017-07-22 LAB — RAPID URINE DRUG SCREEN, HOSP PERFORMED
Amphetamines: NOT DETECTED
Barbiturates: NOT DETECTED
Benzodiazepines: NOT DETECTED
COCAINE: NOT DETECTED
OPIATES: NOT DETECTED
Tetrahydrocannabinol: POSITIVE — AB

## 2017-07-22 LAB — CBC
HEMATOCRIT: 39.9 % (ref 39.0–52.0)
Hemoglobin: 13.9 g/dL (ref 13.0–17.0)
MCH: 30.9 pg (ref 26.0–34.0)
MCHC: 34.8 g/dL (ref 30.0–36.0)
MCV: 88.7 fL (ref 78.0–100.0)
PLATELETS: 253 10*3/uL (ref 150–400)
RBC: 4.5 MIL/uL (ref 4.22–5.81)
RDW: 13.5 % (ref 11.5–15.5)
WBC: 5.4 10*3/uL (ref 4.0–10.5)

## 2017-07-22 LAB — ACETAMINOPHEN LEVEL: Acetaminophen (Tylenol), Serum: <10 ug/mL — ABNORMAL LOW (ref 10–30)

## 2017-07-22 LAB — SALICYLATE LEVEL: Salicylate Lvl: <7 mg/dL (ref 2.8–30.0)

## 2017-07-22 MED ORDER — ACETAMINOPHEN 325 MG PO TABS
650.0000 mg | ORAL_TABLET | ORAL | Status: DC | PRN
  Administered 2017-07-23: 650 mg via ORAL
  Filled 2017-07-22: qty 2

## 2017-07-22 MED ORDER — ONDANSETRON HCL 4 MG PO TABS: 4.0000 mg | ORAL_TABLET | Freq: Three times a day (TID) | ORAL | Status: DC | PRN

## 2017-07-22 NOTE — ED Notes (Signed)
No answer when called for vitals. 

## 2017-07-22 NOTE — ED Notes (Signed)
Dr. Kohut in with pt. 

## 2017-07-22 NOTE — ED Provider Notes (Signed)
[facility]-EMERGENCY DEPT Provider Note   CSN: [id number] Arrival date & time: 07/22/17  1036     History   Chief Complaint Chief Complaint  Patient presents with  . Suicidal    HPI Todd Aguilar is a 21 y.o. male.  HPI   21 year old male male with depression and suicidal ideation. Ongoing issues for years. Denies any specific stressor recently but says accumulation everything that he has been through in the past has made him have increasing suicidal thoughts. (chronic leg pain, homeless, jobless). He reports previous suicide attempts. He says he is not a multiple ways to harm himself more recently. Walking into traffic, jumping off of bridge, overdosing, etc.   Past Medical History:  Diagnosis Date  . Anxiety   . Depression   . Muscular disease     Patient Active Problem List   Diagnosis Date Noted  . MDD (major depressive disorder), recurrent severe, without psychosis (HCC) 06/02/2017    Past Surgical History:  Procedure Laterality Date  . CLOSED REDUCTION FINGER WITH PERCUTANEOUS PINNING Left 11/25/2016   Procedure: CLOSED REDUCTION WITH PERCUTANEOUS PINNING - INDEX AND LONG FINGERS;  Surgeon: Bradly Bienenstock, MD;  Location: [facility] OR;  Service: Orthopedics;  Laterality: Left;  LEFT INDEX AND LONG FINGERS  . TONSILLECTOMY         Home Medications    Prior to Admission medications   Medication Sig Start Date End Date Taking? Authorizing Provider  hydrOXYzine (ATARAX/VISTARIL) 25 MG tablet Take 1 tablet (25 mg total) by mouth every 6 (six) hours as needed for anxiety. Patient not taking: Reported on 06/25/2017 06/05/17   Money, Gerlene Burdock, FNP  mirtazapine (REMERON) 7.5 MG tablet Take 1 tablet (7.5 mg total) by mouth at bedtime. Patient not taking: Reported on 06/25/2017 06/05/17   Money, Gerlene Burdock, FNP    Family History Family History  Problem Relation Age of Onset  . Adopted: Yes    Social History Social History  Substance Use Topics  . Smoking status: Heavy Tobacco Smoker      Packs/day: 0.50    Years: 1.00    Types: Cigarettes  . Smokeless tobacco: Former Neurosurgeon    Types: Chew  . Alcohol use Not on file     Allergies   Patient has no known allergies.   Review of Systems Review of Systems  All systems reviewed and negative, other than as noted in HPI.  Physical Exam Updated Vital Signs BP 122/63 (BP Location: Right Arm)   Pulse 63   Temp 98 F (36.7 C) (Oral)   Resp 18   SpO2 100%   Physical Exam  Constitutional: He appears well-developed and well-nourished. No distress.  HENT:  Head: Normocephalic and atraumatic.  Eyes: Conjunctivae are normal. Right eye exhibits no discharge. Left eye exhibits no discharge.  Neck: Neck supple.  Cardiovascular: Normal rate, regular rhythm and normal heart sounds.  Exam reveals no gallop and no friction rub.   No murmur heard. Pulmonary/Chest: Effort normal and breath sounds normal. No respiratory distress.  Abdominal: Soft. He exhibits no distension. There is no tenderness.  Musculoskeletal: He exhibits no edema or tenderness.  Neurological: He is alert.  Skin: Skin is warm and dry.  Psychiatric:  Speech clear. Content appropriate. Decent eye contact. Flat affect. Doesn't appear to be responding to internal stimuli.  Nursing note and vitals reviewed.    ED Treatments / Results  Labs (all labs ordered are listed, but only abnormal results are displayed) Labs Reviewed  COMPREHENSIVE METABOLIC PANEL -  Abnormal; Notable for the following:       Result Value   ALT 14 (*)    All other components within normal limits  ACETAMINOPHEN LEVEL - Abnormal; Notable for the following:    Acetaminophen (Tylenol), Serum <10 (*)    All other components within normal limits  RAPID URINE DRUG SCREEN, HOSP PERFORMED - Abnormal; Notable for the following:    Tetrahydrocannabinol POSITIVE (*)    All other components within normal limits  ETHANOL  SALICYLATE LEVEL  CBC    EKG  EKG Interpretation None        Radiology No results found.  Procedures Procedures (including critical care time)  Medications Ordered in ED Medications - No data to display   Initial Impression / Assessment and Plan / ED Course  I have reviewed the triage vital signs and the nursing notes.  Pertinent labs & imaging results that were available during my care of the patient were reviewed by me and considered in my medical decision making (see chart for details).     21 year old male with increasing suicidal thoughts/depression. Medically cleared. TTS evaluation.  Final Clinical Impressions(s) / ED Diagnoses   Final diagnoses:  Depression, unspecified depression type    New Prescriptions New Prescriptions   No medications on file     Raeford Razor, MD 07/26/17 1250
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
## 2017-07-22 NOTE — BH Assessment (Signed)
BHH Assessment Progress Note  Per Julieanne Cotton, [facility] has no available beds, and per Vernona Rieger, NP, pr needs medical clearance.  Spoke with Kennyth Arnold, charge nurse at [facility], who said that they are too full and have no beds.  Called [facility] and charge nurse was unable to come to the phone. Norified [place], Charity fundraiser, that pt needs medical clearance and placement. [facility] will transport pt to [facility].

## 2017-07-22 NOTE — ED Notes (Signed)
`  ORDERED [facility]

## 2017-07-22 NOTE — ED Triage Notes (Signed)
Pt in c/o suicidal ideation "for years", when asked what was different today he reports that he is having a lot of leg pain, and the pain is making him more suicidal so now he would like to go to behavioral health. No distress noted, denies plan for how to hurt himself. Denies substance abuse.

## 2017-07-22 NOTE — Progress Notes (Addendum)
CSW reviewed pt chart. Per Fransisca Kaufmann, PMHNP, pt meets criteria for inpatient hospitalization.  Pt referral packet sent to the following hospitals: [facility]  Disposition:  CSW will continue to follow for placement.  Timmothy Euler. Kaylyn Lim, MSW, LCSWA Disposition Clinical Social Work [phone] (cell) [phone] (office)

## 2017-07-23 DIAGNOSIS — F419 Anxiety disorder, unspecified: Secondary | ICD-10-CM | POA: Diagnosis not present

## 2017-07-23 DIAGNOSIS — R45851 Suicidal ideations: Secondary | ICD-10-CM

## 2017-07-23 DIAGNOSIS — M629 Disorder of muscle, unspecified: Secondary | ICD-10-CM

## 2017-07-23 DIAGNOSIS — F332 Major depressive disorder, recurrent severe without psychotic features: Secondary | ICD-10-CM | POA: Diagnosis not present

## 2017-07-23 DIAGNOSIS — R443 Hallucinations, unspecified: Secondary | ICD-10-CM | POA: Diagnosis not present

## 2017-07-23 DIAGNOSIS — R45850 Homicidal ideations: Secondary | ICD-10-CM | POA: Diagnosis not present

## 2017-07-23 DIAGNOSIS — F17210 Nicotine dependence, cigarettes, uncomplicated: Secondary | ICD-10-CM

## 2017-07-23 NOTE — ED Notes (Signed)
Telepsych being performed. 

## 2017-07-23 NOTE — Progress Notes (Signed)
Per Ferne Reus, NP, the patient does not meet criteria for inpatient treatment. The patient is recommended for discharge.    Jerrol Banana, RN notified.    Baldo Daub MSW, LCSWA CSW Disposition [phone]
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
## 2017-12-31 IMAGING — CR DG CHEST 1V PORT
1 series · 1 of 1 positions shown · non-contrast
Comparison: Chest radiograph performed 12/31/2003

CLINICAL DATA: Status post seizure, acute onset. Loss of
consciousness. Initial encounter.

EXAM:
PORTABLE CHEST 1 VIEW

[portable]
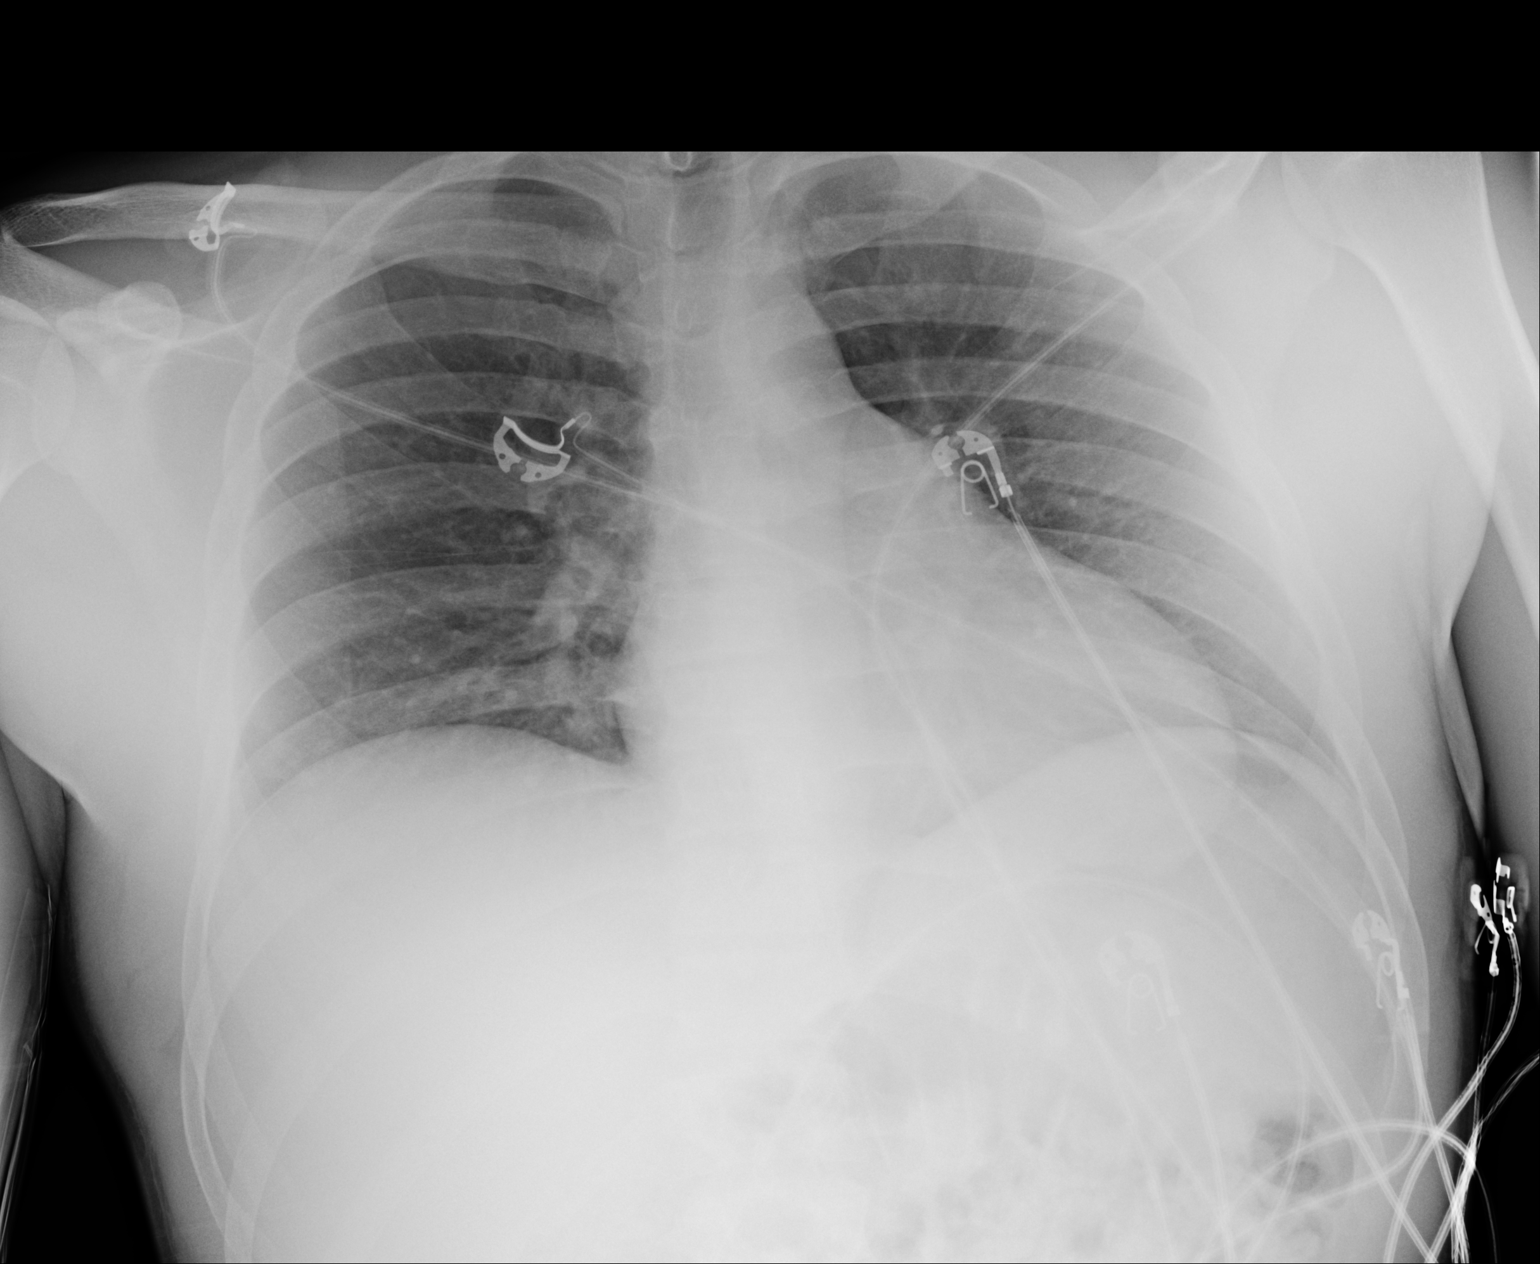

[1 of 1 positions shown; findings below may reference images not displayed]

FINDINGS: The lungs are well-aerated and clear. There is no evidence of focal
opacification, pleural effusion or pneumothorax.

The cardiomediastinal silhouette is within normal limits. No acute
osseous abnormalities are seen.
IMPRESSION: No acute cardiopulmonary process seen.
